# Patient Record
Sex: Female | Born: 1980
Health system: Southern US, Community
[De-identification: ages and names within clinical notes are randomized; demographics above are authoritative.]

## PROBLEM LIST (undated history)

## (undated) DIAGNOSIS — Z973 Presence of spectacles and contact lenses: Secondary | ICD-10-CM

## (undated) DIAGNOSIS — E559 Vitamin D deficiency, unspecified: Secondary | ICD-10-CM

## (undated) DIAGNOSIS — E282 Polycystic ovarian syndrome: Secondary | ICD-10-CM

## (undated) DIAGNOSIS — I10 Essential (primary) hypertension: Secondary | ICD-10-CM

## (undated) DIAGNOSIS — N200 Calculus of kidney: Secondary | ICD-10-CM

## (undated) DIAGNOSIS — Z87442 Personal history of urinary calculi: Secondary | ICD-10-CM

## (undated) HISTORY — DX: Calculus of kidney: N20.0

## (undated) HISTORY — DX: Polycystic ovarian syndrome: E28.2

## (undated) HISTORY — DX: Vitamin D deficiency, unspecified: E55.9

## (undated) HISTORY — PX: MOLE REMOVAL: SHX2046

---

## 2013-07-21 ENCOUNTER — Emergency Department (HOSPITAL_COMMUNITY)
Admission: EM | Admit: 2013-07-21 | Discharge: 2013-07-21 | Disposition: A | Discharge status: Home / Self Care | Payer: PRIVATE HEALTH INSURANCE | Attending: Emergency Medicine | Admitting: Emergency Medicine

## 2013-07-21 ENCOUNTER — Emergency Department (HOSPITAL_COMMUNITY): Payer: PRIVATE HEALTH INSURANCE

## 2013-07-21 ENCOUNTER — Encounter (HOSPITAL_COMMUNITY): Payer: Self-pay | Admitting: Emergency Medicine

## 2013-07-21 DIAGNOSIS — R112 Nausea with vomiting, unspecified: Secondary | ICD-10-CM | POA: Insufficient documentation

## 2013-07-21 DIAGNOSIS — R109 Unspecified abdominal pain: Secondary | ICD-10-CM | POA: Insufficient documentation

## 2013-07-21 LAB — CBC WITH DIFFERENTIAL/PLATELET
Basophils Absolute: 0 10*3/uL (ref 0.0–0.1)
Basophils Relative: 0 % (ref 0–1)
Eosinophils Absolute: 0.1 10*3/uL (ref 0.0–0.7)
Eosinophils Relative: 1 % (ref 0–5)
HCT: 44.8 % (ref 36.0–46.0)
Hemoglobin: 15 g/dL (ref 12.0–15.0)
Lymphocytes Relative: 23 % (ref 12–46)
Lymphs Abs: 1.9 10*3/uL (ref 0.7–4.0)
MCH: 30.6 pg (ref 26.0–34.0)
MCHC: 33.5 g/dL (ref 30.0–36.0)
MCV: 91.4 fL (ref 78.0–100.0)
Monocytes Absolute: 0.5 10*3/uL (ref 0.1–1.0)
Monocytes Relative: 6 % (ref 3–12)
Neutro Abs: 5.7 10*3/uL (ref 1.7–7.7)
Neutrophils Relative %: 70 % (ref 43–77)
Platelets: 325 10*3/uL (ref 150–400)
RBC: 4.9 MIL/uL (ref 3.87–5.11)
RDW: 12.6 % (ref 11.5–15.5)
WBC: 8.2 10*3/uL (ref 4.0–10.5)

## 2013-07-21 LAB — URINE MICROSCOPIC-ADD ON

## 2013-07-21 LAB — BASIC METABOLIC PANEL
BUN: 8 mg/dL (ref 6–23)
CO2: 22 mEq/L (ref 19–32)
Calcium: 8.9 mg/dL (ref 8.4–10.5)
Chloride: 105 mEq/L (ref 96–112)
Creatinine, Ser: 0.88 mg/dL (ref 0.50–1.10)
GFR calc Af Amer: >90 mL/min (ref >90)
GFR calc non Af Amer: 85 mL/min — ABNORMAL LOW (ref >90)
Glucose, Bld: 103 mg/dL — ABNORMAL HIGH (ref 70–99)
Potassium: 4.1 mEq/L (ref 3.7–5.3)
Sodium: 139 mEq/L (ref 137–147)

## 2013-07-21 LAB — URINALYSIS, ROUTINE W REFLEX MICROSCOPIC
Bilirubin Urine: NEGATIVE
Glucose, UA: NEGATIVE mg/dL
Ketones, ur: NEGATIVE mg/dL
Nitrite: NEGATIVE
Protein, ur: NEGATIVE mg/dL
Specific Gravity, Urine: 1.019 (ref 1.005–1.030)
Urobilinogen, UA: 0.2 mg/dL (ref 0.0–1.0)
pH: 5.5 (ref 5.0–8.0)

## 2013-07-21 MED ORDER — HYDROCODONE-ACETAMINOPHEN 5-325 MG PO TABS: 1.0000 | ORAL_TABLET | Freq: Four times a day (QID) | ORAL | Status: DC | PRN

## 2013-07-21 NOTE — Discharge Instructions (Signed)
Abdominal Pain, Women °Abdominal (stomach, pelvic, or belly) pain can be caused by many things. It is important to tell your doctor: °· The location of the pain. °· Does it come and go or is it present all the time? °· Are there things that start the pain (eating certain foods, exercise)? °· Are there other symptoms associated with the pain (fever, nausea, vomiting, diarrhea)? °All of this is helpful to know when trying to find the cause of the pain. °CAUSES  °· Stomach: virus or bacteria infection, or ulcer. °· Intestine: appendicitis (inflamed appendix), regional ileitis (Crohn's disease), ulcerative colitis (inflamed colon), irritable bowel syndrome, diverticulitis (inflamed diverticulum of the colon), or cancer of the stomach or intestine. °· Gallbladder disease or stones in the gallbladder. °· Kidney disease, kidney stones, or infection. °· Pancreas infection or cancer. °· Fibromyalgia (pain disorder). °· Diseases of the female organs: °· Uterus: fibroid (non-cancerous) tumors or infection. °· Fallopian tubes: infection or tubal pregnancy. °· Ovary: cysts or tumors. °· Pelvic adhesions (scar tissue). °· Endometriosis (uterus lining tissue growing in the pelvis and on the pelvic organs). °· Pelvic congestion syndrome (female organs filling up with blood just before the menstrual period). °· Pain with the menstrual period. °· Pain with ovulation (producing an egg). °· Pain with an IUD (intrauterine device, birth control) in the uterus. °· Cancer of the female organs. °· Functional pain (pain not caused by a disease, may improve without treatment). °· Psychological pain. °· Depression. °DIAGNOSIS  °Your doctor will decide the seriousness of your pain by doing an examination. °· Blood tests. °· X-rays. °· Ultrasound. °· CT scan (computed tomography, special type of X-ray). °· MRI (magnetic resonance imaging). °· Cultures, for infection. °· Barium enema (dye inserted in the large intestine, to better view it with  X-rays). °· Colonoscopy (looking in intestine with a lighted tube). °· Laparoscopy (minor surgery, looking in abdomen with a lighted tube). °· Major abdominal exploratory surgery (looking in abdomen with a large incision). °TREATMENT  °The treatment will depend on the cause of the pain.  °· Many cases can be observed and treated at home. °· Over-the-counter medicines recommended by your caregiver. °· Prescription medicine. °· Antibiotics, for infection. °· Birth control pills, for painful periods or for ovulation pain. °· Hormone treatment, for endometriosis. °· Nerve blocking injections. °· Physical therapy. °· Antidepressants. °· Counseling with a psychologist or psychiatrist. °· Minor or major surgery. °HOME CARE INSTRUCTIONS  °· Do not take laxatives, unless directed by your caregiver. °· Take over-the-counter pain medicine only if ordered by your caregiver. Do not take aspirin because it can cause an upset stomach or bleeding. °· Try a clear liquid diet (broth or water) as ordered by your caregiver. Slowly move to a bland diet, as tolerated, if the pain is related to the stomach or intestine. °· Have a thermometer and take your temperature several times a day, and record it. °· Bed rest and sleep, if it helps the pain. °· Avoid sexual intercourse, if it causes pain. °· Avoid stressful situations. °· Keep your follow-up appointments and tests, as your caregiver orders. °· If the pain does not go away with medicine or surgery, you may try: °· Acupuncture. °· Relaxation exercises (yoga, meditation). °· Group therapy. °· Counseling. °SEEK MEDICAL CARE IF:  °· You notice certain foods cause stomach pain. °· Your home care treatment is not helping your pain. °· You need stronger pain medicine. °· You want your IUD removed. °· You feel faint or   lightheaded. °· You develop nausea and vomiting. °· You develop a rash. °· You are having side effects or an allergy to your medicine. °SEEK IMMEDIATE MEDICAL CARE IF:  °· Your  pain does not go away or gets worse. °· You have a fever. °· Your pain is felt only in portions of the abdomen. The right side could possibly be appendicitis. The left lower portion of the abdomen could be colitis or diverticulitis. °· You are passing blood in your stools (bright red or black tarry stools, with or without vomiting). °· You have blood in your urine. °· You develop chills, with or without a fever. °· You pass out. °MAKE SURE YOU:  °· Understand these instructions. °· Will watch your condition. °· Will get help right away if you are not doing well or get worse. °Document Released: 04/16/2007 Document Revised: 09/11/2011 Document Reviewed: 05/06/2009 °[employer]® Patient Information ©2014 [employer]. ° °

## 2013-07-21 NOTE — ED Notes (Signed)
Pt states left flank towards abdomen and groin pain.   N/V also.  No change in urination.  No fever.

## 2013-07-21 NOTE — ED Provider Notes (Signed)
CSN: [id number]     Arrival date & time 07/21/13  1320 History   First MD Initiated Contact with Patient 07/21/13 1555     Chief Complaint  Patient presents with  . Flank Pain   (Consider location/radiation/quality/duration/timing/severity/associated sxs/prior Treatment) Patient is a 33 y.o. female presenting with flank pain.  Flank Pain Associated symptoms include abdominal pain. Pertinent negatives include no chest pain and no shortness of breath.    History reviewed. No pertinent past medical history. History reviewed. No pertinent past surgical history. History reviewed. No pertinent family history. History  Substance Use Topics  . Smoking status: Never Smoker   . Smokeless tobacco: Not on file  . Alcohol Use: No   OB History   Grav Para Term Preterm Abortions TAB SAB Ect Mult Living                 Review of Systems  Constitutional: Negative for fever.  Respiratory: Negative for cough and shortness of breath.   Cardiovascular: Negative for chest pain.  Gastrointestinal: Positive for nausea, vomiting and abdominal pain. Negative for diarrhea.  Genitourinary: Positive for flank pain.  All other systems reviewed and are negative.    Allergies  Review of patient's allergies indicates no known allergies.  Home Medications   Current Outpatient Rx  Name  Route  Sig  Dispense  Refill  . ibuprofen (ADVIL,MOTRIN) 200 MG tablet   Oral   Take 200 mg by mouth every 6 (six) hours as needed (pain).          BP 122/83  Pulse 93  Temp(Src) 98.6 F (37 C) (Oral)  Resp 16  SpO2 96%  LMP 07/07/2013 Physical Exam  Nursing note and vitals reviewed. Constitutional: She is oriented to person, place, and time. She appears well-developed and well-nourished. No distress.  HENT:  Head: Normocephalic and atraumatic.  Mouth/Throat: Oropharynx is clear and moist. No oropharyngeal exudate.  Eyes: EOM are normal. Pupils are equal, round, and reactive to light.  Neck: Normal range  of motion. Neck supple.  Cardiovascular: Normal rate and regular rhythm.  Exam reveals no friction rub.   No murmur heard. Pulmonary/Chest: Effort normal and breath sounds normal. No respiratory distress. She has no wheezes. She has no rales.  Abdominal: Soft. She exhibits no distension. There is no tenderness. There is no rebound.  Musculoskeletal: Normal range of motion. She exhibits no edema.  Neurological: She is alert and oriented to person, place, and time. She exhibits normal muscle tone.  Skin: No rash noted. She is not diaphoretic.    ED Course  Procedures (including critical care time) Labs Review Labs Reviewed  BASIC METABOLIC PANEL - Abnormal; Notable for the following:    Glucose, Bld 103 (*)    GFR calc non Af Amer 85 (*)    All other components within normal limits  URINALYSIS, ROUTINE W REFLEX MICROSCOPIC - Abnormal; Notable for the following:    APPearance CLOUDY (*)    Hgb urine dipstick LARGE (*)    Leukocytes, UA TRACE (*)    All other components within normal limits  CBC WITH DIFFERENTIAL  URINE MICROSCOPIC-ADD ON   Imaging Review Ct Abdomen Pelvis Wo Contrast  07/21/2013   CLINICAL DATA:  Left-sided abdominal pain.  EXAM: CT ABDOMEN AND PELVIS WITHOUT CONTRAST  TECHNIQUE: Multidetector CT imaging of the abdomen and pelvis was performed following the standard protocol without intravenous contrast.  COMPARISON:  None.  FINDINGS: Lung bases are essentially clear.  Heart is normal in  size.  Normal liver and spleen.  Shaky densities are suggested in the mildly distended gallbladder. There may be gallstones. No evidence of acute cholecystitis. No bile duct dilation.  Normal pancreas.  No adrenal masses.  4 mm nonobstructing stone in the lower pole of the left kidney. No other intrarenal stones. No renal masses. No hydronephrosis. The ureters are normal in course and in caliber without stones the bladder is minimally distended but otherwise unremarkable. No evidence of  obstructive uropathy.  Normal uterus and adnexa.  No adenopathy.  No abnormal fluid collections.  Normal:  .  Normal small bowel.  Normal appendix.  Minor degenerative changes noted of the visualized spine. No osteoblastic or osteolytic lesions.  IMPRESSION: 1. No acute findings. No ureteral stone or evidence of obstructive uropathy. 2. 4 mm nonobstructing stone in the lower pole of the left kidney. No other intrarenal stones. No other renal abnormalities. 3. No other significant findings. No findings to explain left sided abdominal pain.   Electronically Signed   By: Amie Portlandavid  Ormond M.D.   On: 07/21/2013 17:28    EKG Interpretation   None       MDM   1. Left flank pain    70F presents with L flank pain. Gone now. Intermittent, crampy, radiating to L groin. Vomiting x 3. No fevers. Pain free at this time. No hematuria noted. Exam benign. No history, no family hx of stones. UA with hematuria. Likely stone, I gave patient option of medical management without CT due to no change in renal function, patient would like CT. CT is warranted and is reasonable.  CT negative for intra-ureter stone. She does have small stone in kidney that is nonobstructing. With hematuria, query recently passed stone. I informed patient that we need to do a pelvic to do a complete workup, patient deferred. Stable for discharge.   Dagmar HaitWilliam Tabita Corbo, MD 07/22/13 0001

## 2013-11-14 ENCOUNTER — Ambulatory Visit (INDEPENDENT_AMBULATORY_CARE_PROVIDER_SITE_OTHER): Payer: No Typology Code available for payment source | Admitting: Internal Medicine

## 2013-11-14 VITALS — BP 124/88 | HR 96 | Temp 98.5°F | Resp 18 | Ht 63.5 in | Wt 207.0 lb

## 2013-11-14 DIAGNOSIS — Z6836 Body mass index (BMI) 36.0-36.9, adult: Secondary | ICD-10-CM

## 2013-11-14 DIAGNOSIS — R030 Elevated blood-pressure reading, without diagnosis of hypertension: Secondary | ICD-10-CM

## 2013-11-14 DIAGNOSIS — IMO0001 Reserved for inherently not codable concepts without codable children: Secondary | ICD-10-CM

## 2013-11-14 LAB — POCT URINALYSIS DIPSTICK
Bilirubin, UA: NEGATIVE
Blood, UA: NEGATIVE
Glucose, UA: NEGATIVE
Ketones, UA: 15
Leukocytes, UA: NEGATIVE
Nitrite, UA: NEGATIVE
Protein, UA: NEGATIVE
Spec Grav, UA: 1.02
Urobilinogen, UA: 0.2
pH, UA: 7

## 2013-11-14 LAB — POCT CBC
Granulocyte percent: 56.3 %G (ref 37–80)
HCT, POC: 47 % (ref 37.7–47.9)
Hemoglobin: 15.1 g/dL (ref 12.2–16.2)
Lymph, poc: 2.9 (ref 0.6–3.4)
MCH, POC: 30.6 pg (ref 27–31.2)
MCHC: 32.1 g/dL (ref 31.8–35.4)
MCV: 95.4 fL (ref 80–97)
MID (cbc): 0.5 (ref 0–0.9)
MPV: 9.5 fL (ref 0–99.8)
POC Granulocyte: 4.3 (ref 2–6.9)
POC LYMPH PERCENT: 37.3 %L (ref 10–50)
POC MID %: 6.4 %M (ref 0–12)
Platelet Count, POC: 377 10*3/uL (ref 142–424)
RBC: 4.93 M/uL (ref 4.04–5.48)
RDW, POC: 12.5 %
WBC: 7.7 10*3/uL (ref 4.6–10.2)

## 2013-11-14 LAB — LIPID PANEL
Cholesterol: 130 mg/dL (ref 0–200)
HDL: 50 mg/dL (ref >39)
LDL Cholesterol: 69 mg/dL (ref 0–99)
Total CHOL/HDL Ratio: 2.6 Ratio
Triglycerides: 54 mg/dL (ref <150)
VLDL: 11 mg/dL (ref 0–40)

## 2013-11-14 LAB — COMPREHENSIVE METABOLIC PANEL
ALT: 17 U/L (ref 0–35)
AST: 13 U/L (ref 0–37)
Albumin: 4.2 g/dL (ref 3.5–5.2)
Alkaline Phosphatase: 73 U/L (ref 39–117)
BUN: 11 mg/dL (ref 6–23)
CO2: 23 mEq/L (ref 19–32)
Calcium: 9.6 mg/dL (ref 8.4–10.5)
Chloride: 107 mEq/L (ref 96–112)
Creat: 0.68 mg/dL (ref 0.50–1.10)
Glucose, Bld: 87 mg/dL (ref 70–99)
Potassium: 4.5 mEq/L (ref 3.5–5.3)
Sodium: 140 mEq/L (ref 135–145)
Total Bilirubin: 0.5 mg/dL (ref 0.2–1.2)
Total Protein: 7.1 g/dL (ref 6.0–8.3)

## 2013-11-14 NOTE — Progress Notes (Signed)
   Subjective:    Patient ID: Margaret Rice, female    DOB: 08/10/1980, 33 y.o.   MRN: [id number] This chart was scribed for Ellamae Siaobert Jhovani Griswold, MD by Danella Maiersaroline Early, ED Scribe. This patient was seen in room 1 and the patient's care was started at 4:21 PM.  Chief Complaint  Patient presents with  . Hypertension    around 130/90 when taken at home    HPI HPI Comments: Margaret Rice is a 33 y.o. female who presents to the [facility] complaining of recent high blood pressure readings. She states she has been checking her BP with the cuff at her gym every day, highest recording was 140/96. She states her blood pressure tends to be normal in the mornings. She states she is under considerable stress because she is graduating from law school and is [facility]. Her mom and dad both have HTN. She has no chronic medical problems and is not on any daily medications besides vitamins. She had a kidney stone in January. She has not had a UA since then. She states she has lost 30 pounds recently due to diet and daily exercise. She denies fatigue, sleep difficulty, urinary problems, digestions problems, sleep disorders. She has never given birth. She denies SOB, palpitations, leg swelling. No family h/o DM. She is not a smoker.   PCP - No PCP Per Patient  There are no active problems to display for this patient.  No current outpatient prescriptions on file prior to visit.   No current facility-administered medications on file prior to visit.  no drug use/nonsmok FH--HTN parents  Review of Systems  Constitutional: Positive for activity change (exercise). Negative for appetite change, fatigue and unexpected weight change.  Eyes: Negative for visual disturbance.  Respiratory: Negative for chest tightness and shortness of breath.   Cardiovascular: Negative for chest pain, palpitations and leg swelling.  Gastrointestinal: Negative for vomiting, abdominal pain, diarrhea  and constipation.  Genitourinary: Negative for dysuria, urgency, frequency, hematuria, decreased urine volume and difficulty urinating.  Neurological: Negative for headaches.  Psychiatric/Behavioral: Negative for sleep disturbance, dysphoric mood and decreased concentration.       Objective:   Physical Exam  Nursing note and vitals reviewed. Constitutional: She is oriented to person, place, and time. She appears well-developed and well-nourished. No distress.  HENT:  Head: Normocephalic and atraumatic.  Eyes: EOM are normal.  Neck: Neck supple. No tracheal deviation present.  Cardiovascular: Normal rate.   Pulmonary/Chest: Effort normal. No respiratory distress.  Abdominal: There is no hepatosplenomegaly, splenomegaly or hepatomegaly.  Musculoskeletal: Normal range of motion.  Neurological: She is alert and oriented to person, place, and time.  Skin: Skin is warm and dry.  Psychiatric: She has a normal mood and affect. Her behavior is normal.     Filed Vitals:   11/14/13 1531  BP: 124/88  Pulse: 96  Temp: 98.5 F (36.9 C)  Resp: 18  Height: 5' 3.5" (1.613 m)  Weight: 207 lb (93.895 kg)  SpO2: 98%        Assessment & Plan:    I have completed the patient encounter in its entirety as documented by the scribe, with editing by me where necessary. Delvonte Berenson P. Merla Richesoolittle, M.D. Blood pressure elevated - Plan: POCT urinalysis dipstick, POCT CBC, Comprehensive metabolic panel, TSH, Lipid panel  BMI>36  To continue wt loss w/diet/exec and reck after bar exams

## 2013-11-15 DIAGNOSIS — Z6836 Body mass index (BMI) 36.0-36.9, adult: Secondary | ICD-10-CM | POA: Insufficient documentation

## 2013-11-15 LAB — TSH: TSH: 0.731 u[IU]/mL (ref 0.350–4.500)

## 2013-11-19 ENCOUNTER — Encounter: Payer: Self-pay | Admitting: Internal Medicine

## 2016-07-25 ENCOUNTER — Encounter: Payer: Self-pay | Admitting: Family Medicine

## 2016-07-25 ENCOUNTER — Ambulatory Visit (INDEPENDENT_AMBULATORY_CARE_PROVIDER_SITE_OTHER): Payer: BLUE CROSS/BLUE SHIELD | Admitting: Family Medicine

## 2016-07-25 VITALS — BP 131/89 | HR 100 | Temp 98.2°F | Resp 16 | Ht 64.0 in | Wt 201.6 lb

## 2016-07-25 DIAGNOSIS — Z0000 Encounter for general adult medical examination without abnormal findings: Secondary | ICD-10-CM

## 2016-07-25 DIAGNOSIS — Z131 Encounter for screening for diabetes mellitus: Secondary | ICD-10-CM | POA: Diagnosis not present

## 2016-07-25 DIAGNOSIS — Z23 Encounter for immunization: Secondary | ICD-10-CM | POA: Diagnosis not present

## 2016-07-25 DIAGNOSIS — N926 Irregular menstruation, unspecified: Secondary | ICD-10-CM

## 2016-07-25 DIAGNOSIS — E6609 Other obesity due to excess calories: Secondary | ICD-10-CM | POA: Diagnosis not present

## 2016-07-25 DIAGNOSIS — Z13220 Encounter for screening for lipoid disorders: Secondary | ICD-10-CM | POA: Diagnosis not present

## 2016-07-25 DIAGNOSIS — L680 Hirsutism: Secondary | ICD-10-CM

## 2016-07-25 DIAGNOSIS — Z114 Encounter for screening for human immunodeficiency virus [HIV]: Secondary | ICD-10-CM

## 2016-07-25 DIAGNOSIS — Z1329 Encounter for screening for other suspected endocrine disorder: Secondary | ICD-10-CM

## 2016-07-25 DIAGNOSIS — Z6834 Body mass index (BMI) 34.0-34.9, adult: Secondary | ICD-10-CM

## 2016-07-25 LAB — POCT URINALYSIS DIP (MANUAL ENTRY)
Bilirubin, UA: NEGATIVE
Blood, UA: NEGATIVE
Glucose, UA: NEGATIVE
Ketones, POC UA: NEGATIVE
Leukocytes, UA: NEGATIVE
Nitrite, UA: NEGATIVE
Protein Ur, POC: NEGATIVE
Spec Grav, UA: 1.01
Urobilinogen, UA: 0.2
pH, UA: 6

## 2016-07-25 MED ORDER — LEVONORGEST-ETH ESTRAD 91-DAY 0.15-0.03 &0.01 MG PO TABS: 1.0000 | ORAL_TABLET | Freq: Every day | ORAL | 4 refills | Status: DC

## 2016-07-25 NOTE — Patient Instructions (Signed)
   IF you received an x-ray today, you will receive an invoice from [facility]. Please contact [facility] at [phone] with questions or concerns regarding your invoice.   IF you received labwork today, you will receive an invoice from [facility]. Please contact [facility] at [phone] with questions or concerns regarding your invoice.   Our billing staff will not be able to assist you with questions regarding bills from these companies.  You will be contacted with the lab results as soon as they are available. The fastest way to get your results is to activate your My Chart account. Instructions are located on the last page of this paperwork. If you have not heard from us regarding the results in 2 weeks, please contact this office.    Keeping You Healthy  Get These Tests 1. Blood Pressure- Have your blood pressure checked once a year by your health care provider.  Normal blood pressure is 120/80. 2. Weight- Have your body mass index (BMI) calculated to screen for obesity.  BMI is measure of body fat based on height and weight.  You can also calculate your own BMI at www.nhlbisupport.com/bmi/. 3. Cholesterol- Have your cholesterol checked every 5 years starting at age 20 then yearly starting at age 45. 4. Chlamydia, HIV, and other sexually transmitted diseases- Get screened every year until age 25, then within three months of each new sexual provider. 5. Pap Test - Every 1-5 years; discuss with your health care provider. 6. Mammogram- Every 1-2 years starting at age 40--50  Take these medicines  Calcium with Vitamin D-Your body needs 1200 mg of Calcium each day and 800-1000 IU of Vitamin D daily.  Your body can only absorb 500 mg of Calcium at a time so Calcium must be taken in 2 or 3 divided doses throughout the day.  Multivitamin with folic acid- Once daily if it is possible for you to become pregnant.  Get these Immunizations  Gardasil-Series of three doses;  prevents HPV related illness such as genital warts and cervical cancer.  Menactra-Single dose; prevents meningitis.  Tetanus shot- Every 10 years.  Flu shot-Every year.  Take these steps 1. Do not smoke-Your healthcare provider can help you quit.  For tips on how to quit go to www.smokefree.gov or call 1-800 QUITNOW. 2. Be physically active- Exercise 5 days a week for at least 30 minutes.  If you are not already physically active, start slow and gradually work up to 30 minutes of moderate physical activity.  Examples of moderate activity include walking briskly, dancing, swimming, bicycling, etc. 3. Breast Cancer- A self breast exam every month is important for early detection of breast cancer.  For more information and instruction on self breast exams, ask your healthcare provider or www.womenshealth.gov/faq/breast-self-exam.cfm. 4. Eat a healthy diet- Eat a variety of healthy foods such as fruits, vegetables, whole grains, low fat milk, low fat cheeses, yogurt, lean meats, poultry and fish, beans, nuts, tofu, etc.  For more information go to www. Thenutritionsource.org 5. Drink alcohol in moderation- Limit alcohol intake to one drink or less per day. Never drink and drive. 6. Depression- Your emotional health is as important as your physical health.  If you're feeling down or losing interest in things you normally enjoy please talk to your healthcare provider about being screened for depression. 7. Dental visit- Brush and floss your teeth twice daily; visit your dentist twice a year. 8. Eye doctor- Get an eye exam at least every 2 years. 9. Helmet   use- Always wear a helmet when riding a bicycle, motorcycle, rollerblading or skateboarding. 10. Safe sex- If you may be exposed to sexually transmitted infections, use a condom. 11. Seat belts- Seat belts can save your live; always wear one. 12. Smoke/Carbon Monoxide detectors- These detectors need to be installed on the appropriate level of your  home. Replace batteries at least once a year. 13. Skin cancer- When out in the sun please cover up and use sunscreen 15 SPF or higher. 14. Violence- If anyone is threatening or hurting you, please tell your healthcare provider.        

## 2016-07-25 NOTE — Progress Notes (Signed)
Subjective:    Patient ID: Margaret Rice, female    DOB: Dec 15, 1980, 36 y.o.   MRN: [id number]  07/25/2016  Annual Exam and Medication Refill Margaret Rice(VIORELE)  HPI This 36 y.o. female presents for Complete Physical Examination and to establish care.  Last physical:  06-2015 Pap smear:  06-2015; no previous sexual activity; heavy menses and mood swings at that time.   Eye exam: Dental exam:   Heavy menses: lighter with OCP; still heavy and cramping; bleeds 5-7 days with OCPs;   Changes pads five per day or more; saturates pad heavily.  Gynecologist performed pelvic us and concluded that thick endometrium; scheduled D&C and cancelled due to lack of comfort.  Would like referral to gynecologist.  Also recommend bx and camera.  Reluctant.Always had irregular menses.  Ignored self from 2012, went through law school and ignored self.  Taking iron supplement for possible anemia.  Also has performed laser hair removal.   Dermatologist has expressed concerns about PCOS due to irregular menses and hirsutism; previous gynecologist did not feel blood work warranted; "most women have it".     Now working in IT.   Immunization History  Administered Date(s) Administered  . Influenza-Unspecified 04/02/2016  . Tdap 07/25/2016   BP Readings from Last 3 Encounters:  07/25/16 131/89  11/14/13 124/88  07/21/13 122/79   Wt Readings from Last 3 Encounters:  07/25/16 201 lb 9.6 oz (91.4 kg)  11/14/13 207 lb (93.9 kg)    [email or web address]   Review of Systems  Constitutional: Negative for activity change, appetite change, chills, diaphoresis, fatigue, fever and unexpected weight change.  HENT: Negative for congestion, dental problem, drooling, ear discharge, ear pain, facial swelling, hearing loss, mouth sores, nosebleeds, postnasal drip, rhinorrhea, sinus pressure, sneezing, sore throat, tinnitus, trouble swallowing and voice change.   Eyes: Negative for photophobia, pain, discharge, redness, itching  and visual disturbance.  Respiratory: Negative for apnea, cough, choking, chest tightness, shortness of breath, wheezing and stridor.   Cardiovascular: Negative for chest pain, palpitations and leg swelling.  Gastrointestinal: Positive for diarrhea. Negative for abdominal distention, abdominal pain, anal bleeding, blood in stool, constipation, nausea, rectal pain and vomiting.  Endocrine: Negative for cold intolerance, heat intolerance, polydipsia, polyphagia and polyuria.  Genitourinary: Negative for decreased urine volume, difficulty urinating, dyspareunia, dysuria, enuresis, flank pain, frequency, genital sores, hematuria, menstrual problem, pelvic pain, urgency, vaginal bleeding, vaginal discharge and vaginal pain.       Nocturia x 0.  Rare stress incontinence.  Musculoskeletal: Negative for arthralgias, back pain, gait problem, joint swelling, myalgias, neck pain and neck stiffness.  Skin: Negative for color change, pallor, rash and wound.  Allergic/Immunologic: Negative for environmental allergies, food allergies and immunocompromised state.  Neurological: Negative for dizziness, tremors, seizures, syncope, facial asymmetry, speech difficulty, weakness, light-headedness, numbness and headaches.  Hematological: Negative for adenopathy. Does not bruise/bleed easily.  Psychiatric/Behavioral: Negative for agitation, behavioral problems, confusion, decreased concentration, dysphoric mood, hallucinations, self-injury, sleep disturbance and suicidal ideas. The patient is not nervous/anxious and is not hyperactive.        Bedtime 03-12-10; wakes up 6:00am.      Past Medical History:  Diagnosis Date  . Kidney stones    Past Surgical History:  Procedure Laterality Date  . MOLE REMOVAL     BACK   No Known Allergies  Social History   Social History  . Marital status: Single    Spouse name: N/A  . Number of children: N/A  . Years of education: N/A  Occupational History  . Not on file.    Social History Main Topics  . Smoking status: Never Smoker  . Smokeless tobacco: Never Used  . Alcohol use No  . Drug use: No  . Sexual activity: Not on file   Other Topics Concern  . Not on file   Social History Narrative  . No narrative on file   Family History  Problem Relation Age of Onset  . Hypertension Mother   . Hypertension Father   . Diverticulitis Father      PARTIAL COLON REMOVED  . Heart disease Maternal Grandmother   . Heart disease Paternal Grandmother        Objective:    BP 131/89 (BP Location: Right Arm, Patient Position: Sitting, Cuff Size: Normal)   Pulse 100   Temp 98.2 F (36.8 C) (Oral)   Resp 16   Ht 5\' 4"  (1.626 m)   Wt 201 lb 9.6 oz (91.4 kg)   LMP 07/17/2016   SpO2 98%   BMI 34.60 kg/m  Physical Exam  Constitutional: She is oriented to person, place, and time. She appears well-developed and well-nourished. No distress.  HENT:  Head: Normocephalic and atraumatic.  Right Ear: External ear normal.  Left Ear: External ear normal.  Nose: Nose normal.  Mouth/Throat: Oropharynx is clear and moist.  Eyes: Conjunctivae and EOM are normal. Pupils are equal, round, and reactive to light.  Neck: Normal range of motion and full passive range of motion without pain. Neck supple. No JVD present. Carotid bruit is not present. No thyromegaly present.  Cardiovascular: Normal rate, regular rhythm and normal heart sounds.  Exam reveals no gallop and no friction rub.   No murmur heard. Pulmonary/Chest: Effort normal and breath sounds normal. She has no wheezes. She has no rales.  Abdominal: Soft. Bowel sounds are normal. She exhibits no distension and no mass. There is no tenderness. There is no rebound and no guarding.  Musculoskeletal:       Right shoulder: Normal.       Left shoulder: Normal.       Cervical back: Normal.  Lymphadenopathy:    She has no cervical adenopathy.  Neurological: She is alert and oriented to person, place, and time. She  has normal reflexes. No cranial nerve deficit. She exhibits normal muscle tone. Coordination normal.  Skin: Skin is warm and dry. No rash noted. She is not diaphoretic. No erythema. No pallor.  Psychiatric: She has a normal mood and affect. Her behavior is normal. Judgment and thought content normal.  Nursing note and vitals reviewed.  Depression screen PHQ 2/9 07/25/2016  Decreased Interest 0  Down, Depressed, Hopeless 0  PHQ - 2 Score 0        Assessment & Plan:   1. Routine physical examination   2. Screening for diabetes mellitus   3. Screening, lipid   4. Screening for HIV (human immunodeficiency virus)   5. Screening for thyroid disorder   6. Hirsutism   7. Irregular menses   8. Need for Tdap vaccination   9. Class 1 obesity due to excess calories without serious comorbidity with body mass index (BMI) of 34.0 to 34.9 in adult    -anticipatory guidance provided --- exercise, weight loss, low-calorie food choices. -refer to gynecology to evaluate irregular menses further; may desire IUD insertion.  No gynecological exam performed today. -obtain labs. -s/p TDAP. -rx for different OCP provided to decrease frequency of menses.   Orders Placed This Encounter  Procedures  .  Tdap vaccine greater than or equal to 7yo IM  . CBC with Differential/Platelet  . Comprehensive metabolic panel    Order Specific Question:   Has the patient fasted?    Answer:   Yes  . Hemoglobin A1c  . Lipid panel    Order Specific Question:   Has the patient fasted?    Answer:   Yes  . TSH  . HIV antibody  . Testosterone,Free and Total  . FSH/LH  . Ambulatory referral to Gynecology    Referral Priority:   Routine    Referral Type:   Consultation    Referral Reason:   Specialty Services Required    Requested Specialty:   Gynecology    Number of Visits Requested:   1  . POCT urinalysis dipstick   Meds ordered this encounter  Medications  . desogestrel-ethinyl estradiol (VIORELE)  0.15-0.02/0.01 MG (21/5) tablet    Sig: Take 1 tablet by mouth daily.  . Prenatal Vit-Fe Fumarate-FA (PRENATAL VITAMIN PO)    Sig: Take by mouth daily.  . IBUPROFEN PO    Sig: Take by mouth as needed.  . Ferrous Sulfate (IRON SUPPLEMENT PO)    Sig: Take by mouth daily.  . TRETINOIN EX    Sig: Apply topically as needed.  . Levonorgestrel-Ethinyl Estradiol (AMETHIA,CAMRESE) 0.15-0.03 &0.01 MG tablet    Sig: Take 1 tablet by mouth daily.    Dispense:  1 Package    Refill:  4    No Follow-up on file.   Meriah Shands Paulita Fujita, M.D. [facility] [address] [place], [place]  [address] [phone] phone [phone] fax

## 2016-07-26 LAB — CBC WITH DIFFERENTIAL/PLATELET
Basophils Absolute: 0 10*3/uL (ref 0.0–0.2)
Basos: 1 %
EOS (ABSOLUTE): 0.1 10*3/uL (ref 0.0–0.4)
Eos: 2 %
Hematocrit: 46.4 % (ref 34.0–46.6)
Hemoglobin: 15 g/dL (ref 11.1–15.9)
Immature Grans (Abs): 0 10*3/uL (ref 0.0–0.1)
Immature Granulocytes: 0 %
Lymphocytes Absolute: 2.3 10*3/uL (ref 0.7–3.1)
Lymphs: 38 %
MCH: 30.3 pg (ref 26.6–33.0)
MCHC: 32.3 g/dL (ref 31.5–35.7)
MCV: 94 fL (ref 79–97)
Monocytes Absolute: 0.3 10*3/uL (ref 0.1–0.9)
Monocytes: 5 %
Neutrophils Absolute: 3.3 10*3/uL (ref 1.4–7.0)
Neutrophils: 54 %
Platelets: 317 10*3/uL (ref 150–379)
RBC: 4.95 x10E6/uL (ref 3.77–5.28)
RDW: 12.7 % (ref 12.3–15.4)
WBC: 6 10*3/uL (ref 3.4–10.8)

## 2016-07-26 LAB — COMPREHENSIVE METABOLIC PANEL
ALT: 15 IU/L (ref 0–32)
AST: 14 IU/L (ref 0–40)
Albumin/Globulin Ratio: 1.5 (ref 1.2–2.2)
Albumin: 4 g/dL (ref 3.5–5.5)
Alkaline Phosphatase: 72 IU/L (ref 39–117)
BUN/Creatinine Ratio: 12 (ref 9–23)
BUN: 9 mg/dL (ref 6–20)
Bilirubin Total: 0.6 mg/dL (ref 0.0–1.2)
CO2: 22 mmol/L (ref 18–29)
Calcium: 9.5 mg/dL (ref 8.7–10.2)
Chloride: 104 mmol/L (ref 96–106)
Creatinine, Ser: 0.77 mg/dL (ref 0.57–1.00)
GFR calc Af Amer: 113 mL/min/{1.73_m2} (ref >59)
GFR calc non Af Amer: 98 mL/min/{1.73_m2} (ref >59)
Globulin, Total: 2.6 g/dL (ref 1.5–4.5)
Glucose: 97 mg/dL (ref 65–99)
Potassium: 4.9 mmol/L (ref 3.5–5.2)
Sodium: 141 mmol/L (ref 134–144)
Total Protein: 6.6 g/dL (ref 6.0–8.5)

## 2016-07-26 LAB — HIV ANTIBODY (ROUTINE TESTING W REFLEX): HIV Screen 4th Generation wRfx: NONREACTIVE

## 2016-07-26 LAB — FSH/LH
FSH: 4.6 m[IU]/mL
LH: 7 m[IU]/mL

## 2016-07-26 LAB — LIPID PANEL
Chol/HDL Ratio: 3.3 ratio units (ref 0.0–4.4)
Cholesterol, Total: 205 mg/dL — ABNORMAL HIGH (ref 100–199)
HDL: 62 mg/dL (ref >39)
LDL Calculated: 123 mg/dL — ABNORMAL HIGH (ref 0–99)
Triglycerides: 98 mg/dL (ref 0–149)
VLDL Cholesterol Cal: 20 mg/dL (ref 5–40)

## 2016-07-26 LAB — TSH: TSH: 0.722 u[IU]/mL (ref 0.450–4.500)

## 2016-07-26 LAB — TESTOSTERONE,FREE AND TOTAL
Testosterone, Free: 2.3 pg/mL (ref 0.0–4.2)
Testosterone: 42 ng/dL (ref 8–48)

## 2016-07-26 LAB — HEMOGLOBIN A1C
Est. average glucose Bld gHb Est-mCnc: 100 mg/dL
Hgb A1c MFr Bld: 5.1 % (ref 4.8–5.6)

## 2016-07-31 ENCOUNTER — Encounter: Payer: Self-pay | Admitting: Family Medicine

## 2016-07-31 DIAGNOSIS — Z6834 Body mass index (BMI) 34.0-34.9, adult: Secondary | ICD-10-CM

## 2016-07-31 DIAGNOSIS — L680 Hirsutism: Secondary | ICD-10-CM | POA: Insufficient documentation

## 2016-07-31 DIAGNOSIS — E6609 Other obesity due to excess calories: Secondary | ICD-10-CM | POA: Insufficient documentation

## 2017-05-14 ENCOUNTER — Ambulatory Visit (INDEPENDENT_AMBULATORY_CARE_PROVIDER_SITE_OTHER): Payer: BLUE CROSS/BLUE SHIELD | Admitting: Emergency Medicine

## 2017-05-14 ENCOUNTER — Other Ambulatory Visit: Payer: Self-pay

## 2017-05-14 ENCOUNTER — Encounter: Payer: Self-pay | Admitting: Emergency Medicine

## 2017-05-14 ENCOUNTER — Ambulatory Visit (INDEPENDENT_AMBULATORY_CARE_PROVIDER_SITE_OTHER): Payer: BLUE CROSS/BLUE SHIELD

## 2017-05-14 VITALS — BP 124/68 | HR 104 | Temp 99.0°F | Resp 16 | Ht 63.5 in | Wt 201.4 lb

## 2017-05-14 DIAGNOSIS — M79671 Pain in right foot: Secondary | ICD-10-CM

## 2017-05-14 DIAGNOSIS — S96911A Strain of unspecified muscle and tendon at ankle and foot level, right foot, initial encounter: Secondary | ICD-10-CM | POA: Diagnosis not present

## 2017-05-14 NOTE — Progress Notes (Signed)
Margaret Rice 36 y.o.   Chief Complaint  Patient presents with  . Foot Pain    x 2 days    HISTORY OF PRESENT ILLNESS: This is a 36 y.o. female complaining of right foot pain x 2 days; started after minor trauma sustained while walking.  HPI   Prior to Admission medications   Medication Sig Start Date End Date Taking? Authorizing Provider  Ferrous Sulfate (IRON SUPPLEMENT PO) Take by mouth daily.   Yes [provider]  IBUPROFEN PO Take by mouth as needed.   Yes [provider]  Levonorgestrel-Ethinyl Estradiol (AMETHIA,CAMRESE) 0.15-0.03 &0.01 MG tablet Take 1 tablet by mouth daily. 07/25/16  Yes Ethelda ChickSmith, Kristi M, MD  OVER THE COUNTER MEDICATION daily.   Yes [provider]  OVER THE COUNTER MEDICATION daily.   Yes [provider]  TRETINOIN EX Apply topically as needed.   Yes [provider]  desogestrel-ethinyl estradiol (VIORELE) 0.15-0.02/0.01 MG (21/5) tablet Take 1 tablet by mouth daily.    [provider]  Prenatal Vit-Fe Fumarate-FA (PRENATAL VITAMIN PO) Take by mouth daily.    [provider]    No Known Allergies  Patient Active Problem List   Diagnosis Date Noted  . Class 1 obesity due to excess calories without serious comorbidity with body mass index (BMI) of 34.0 to 34.9 in adult 07/31/2016  . Hirsutism 07/31/2016    Past Medical History:  Diagnosis Date  . Kidney stones     Past Surgical History:  Procedure Laterality Date  . MOLE REMOVAL     BACK    Social History   Socioeconomic History  . Marital status: Single    Spouse name: Not on file  . Number of children: Not on file  . Years of education: Not on file  . Highest education level: Not on file  Social Needs  . Financial resource strain: Not on file  . Food insecurity - worry: Not on file  . Food insecurity - inability: Not on file  . Transportation needs - medical: Not on file  . Transportation needs - non-medical: Not on file   Occupational History  . Not on file  Tobacco Use  . Smoking status: Never Smoker  . Smokeless tobacco: Never Used  Substance and Sexual Activity  . Alcohol use: No  . Drug use: No  . Sexual activity: Not on file  Other Topics Concern  . Not on file  Social History Narrative  . Not on file    Family History  Problem Relation Age of Onset  . Hypertension Mother   . Hypertension Father   . Diverticulitis Father         PARTIAL COLON REMOVED  . Heart disease Maternal Grandmother   . Heart disease Paternal Grandmother      Review of Systems  Constitutional: Negative.  Negative for chills and fever.  Respiratory: Negative for shortness of breath.   Gastrointestinal: Negative for nausea and vomiting.  Musculoskeletal: Positive for joint pain (right foot).  All other systems reviewed and are negative.  Vitals:   05/14/17 1626  BP: 124/68  Pulse: (!) 104  Resp: 16  Temp: 99 F (37.2 C)  SpO2: 99%     Physical Exam  Constitutional: She is oriented to person, place, and time. She appears well-developed and well-nourished.  HENT:  Head: Normocephalic and atraumatic.  Eyes: Pupils are equal, round, and reactive to light.  Cardiovascular: Normal rate.  Pulmonary/Chest: Effort normal.  Musculoskeletal:  Right  foot: NVI with FROM; mild tenderness lateral aspect; no bruising or open wounds.  Neurological: She is alert and oriented to person, place, and time.  Skin: Skin is warm and dry. Capillary refill takes less than 2 seconds.  Psychiatric: She has a normal mood and affect. Her behavior is normal.  Vitals reviewed.  Dg Foot Complete Right  Result Date: 05/14/2017 CLINICAL DATA:  Foot pain. EXAM: RIGHT FOOT COMPLETE - 3+ VIEW COMPARISON:  None. FINDINGS: There is no evidence of fracture or dislocation. There is no evidence of arthropathy or other focal bone abnormality. Soft tissues are unremarkable. IMPRESSION: Negative. Electronically Signed   By: Elsie StainJohn T Curnes M.D.    On: 05/14/2017 16:55    ASSESSMENT & PLAN: [name] was seen today for foot pain.  Diagnoses and all orders for this visit:  Foot pain, right -     DG Foot Complete Right; Future  Strain of right foot, initial encounter   Patient Instructions       IF you received an x-ray today, you will [facility] an [facility]. Please contact [facility] at [phone] with questions or concerns regarding your invoice.   IF you received labwork today, you will receive an invoice from [facility]. Please contact [facility] at [phone] with questions or concerns regarding your invoice.   Our billing staff will not be able to assist you with questions regarding bills from these companies.  You will be contacted with the lab results as soon as they are available. The fastest way to get your results is to activate your My Chart account. Instructions are located on the last page of this paperwork. If you have not heard from us regarding the results in 2 weeks, please contact this office.    Foot Sprain A foot sprain is an injury to one of the strong bands of tissue (ligaments) that connect and support the many bones in your feet. The ligament can be stretched too much or it can tear. A tear can be either partial or complete. The severity of the sprain depends on how much of the ligament was damaged or torn. What are the causes? A foot sprain is usually caused by suddenly twisting or pivoting your foot. What increases the risk? This injury is more likely to occur in people who:  Play a sport, such as basketball or football.  Exercise or play a sport without warming up.  Start a new workout or sport.  Suddenly increase how long or hard they exercise or play a sport.  What are the signs or symptoms? Symptoms of this condition start soon after an injury and include:  Pain, especially in the arch of the foot.  Bruising.  Swelling.  Inability to walk or use the  foot to support body weight.  How is this diagnosed? This condition is diagnosed with a medical history and physical exam. You may also have imaging tests, such as:  X-rays to make sure there are no broken bones (fractures).  MRI to see if the ligament has torn.  How is this treated? Treatment varies depending on the severity of your sprain. Mild sprains can be treated with rest, ice, compression, and elevation (Rice). If your ligament is overstretched or partially torn, treatment usually involves keeping your foot in a fixed position (immobilization) for a period of time. To help you do this, your health care provider will apply a bandage, splint, or walking boot to keep your foot from moving until it heals. You may  also be advised to use crutches or a scooter for a few weeks to avoid bearing weight on your foot while it is healing. If your ligament is fully torn, you may need surgery to reconnect the ligament to the bone. After surgery, a cast or splint will be applied and will need to stay on your foot while it heals. Your health care provider may also suggest exercises or physical therapy to strengthen your foot. Follow these instructions at home: If You Have a Bandage, Splint, or Walking Boot:  Wear it as directed by your health care provider. Remove it only as directed by your health care provider.  Loosen the bandage, splint, or walking boot if your toes become numb and tingle, or if they turn cold and blue. Bathing  If your health care provider approves bathing and showering, cover the bandage or splint with a watertight plastic bag to protect it from water. Do not let the bandage or splint get wet. Managing pain, stiffness, and swelling  If directed, apply ice to the injured area: ? Put ice in a plastic bag. ? Place a towel between your skin and the bag. ? Leave the ice on for 20 minutes, 2-3 times per day.  Move your toes often to avoid stiffness and to lessen  swelling.  Raise (elevate) the injured area above the level of your heart while you are sitting or lying down. Driving  Do not drive or operate heavy machinery while taking pain medicine.  Ask your health care provider when it is safe to drive if you have a bandage, splint, or walking boot on your foot. Activity  Rest as directed by your health care provider.  Do not use the injured foot to support your body weight until your health care provider says that you can. Use crutches or other supportive devices as directed by your health care provider.  Ask your health care provider what activities are safe for you. Gradually increase how much and how far you walk until your health care provider says it is safe to return to full activity.  Do any exercise or physical therapy as directed by your health care provider. General instructions  If a splint was applied, do not put pressure on any part of it until it is fully hardened. This may take several hours.  Take medicines only as directed by your health care provider. These include over-the-counter medicines and prescription medicines.  Keep all follow-up visits as directed by your health care provider. This is important.  When you can walk without pain, wear supportive shoes that have stiff soles. Do not wear flip-flops, and do not walk barefoot. Contact a health care provider if:  Your pain is not controlled with medicine.  Your bruising or swelling gets worse or does not get better with treatment.  Your splint or walking boot is damaged. Get help right away if:  You develop severe numbness or tingling in your foot.  Your foot turns blue, white, or gray, and it feels cold. This information is not intended to replace advice given to you by your health care provider. Make sure you discuss any questions you have with your health care provider. Document Released: 12/09/2001 Document Revised: 11/25/2015 Document Reviewed:  04/22/2014 Elsevier Interactive Patient Education  2018 Elsevier Inc.      Edwina Barth, MD [facility]

## 2017-05-14 NOTE — Patient Instructions (Addendum)
   IF you received an x-ray today, you will receive an invoice from [facility]. Please contact [facility] at [phone] with questions or concerns regarding your invoice.   IF you received labwork today, you will receive an invoice from [facility]. Please contact [facility] at [phone] with questions or concerns regarding your invoice.   Our billing staff will not be able to assist you with questions regarding bills from these companies.  You will be contacted with the lab results as soon as they are available. The fastest way to get your results is to activate your My Chart account. Instructions are located on the last page of this paperwork. If you have not heard from us regarding the results in 2 weeks, please contact this office.    Foot Sprain A foot sprain is an injury to one of the strong bands of tissue (ligaments) that connect and support the many bones in your feet. The ligament can be stretched too much or it can tear. A tear can be either partial or complete. The severity of the sprain depends on how much of the ligament was damaged or torn. What are the causes? A foot sprain is usually caused by suddenly twisting or pivoting your foot. What increases the risk? This injury is more likely to occur in people who:  Play a sport, such as basketball or football.  Exercise or play a sport without warming up.  Start a new workout or sport.  Suddenly increase how long or hard they exercise or play a sport.  What are the signs or symptoms? Symptoms of this condition start soon after an injury and include:  Pain, especially in the arch of the foot.  Bruising.  Swelling.  Inability to walk or use the foot to support body weight.  How is this diagnosed? This condition is diagnosed with a medical history and physical exam. You may also have imaging tests, such as:  X-rays to make sure there are no broken bones (fractures).  MRI to see if the ligament  has torn.  How is this treated? Treatment varies depending on the severity of your sprain. Mild sprains can be treated with rest, ice, compression, and elevation (RICE). If your ligament is overstretched or partially torn, treatment usually involves keeping your foot in a fixed position (immobilization) for a period of time. To help you do this, your health care provider will apply a bandage, splint, or walking boot to keep your foot from moving until it heals. You may also be advised to use crutches or a scooter for a few weeks to avoid bearing weight on your foot while it is healing. If your ligament is fully torn, you may need surgery to reconnect the ligament to the bone. After surgery, a cast or splint will be applied and will need to stay on your foot while it heals. Your health care provider may also suggest exercises or physical therapy to strengthen your foot. Follow these instructions at home: If You Have a Bandage, Splint, or Walking Boot:  Wear it as directed by your health care provider. Remove it only as directed by your health care provider.  Loosen the bandage, splint, or walking boot if your toes become numb and tingle, or if they turn cold and blue. Bathing  If your health care provider approves bathing and showering, cover the bandage or splint with a watertight plastic bag to protect it from water. Do not let the bandage or splint get wet. Managing pain,   stiffness, and swelling  If directed, apply ice to the injured area: ? Put ice in a plastic bag. ? Place a towel between your skin and the bag. ? Leave the ice on for 20 minutes, 2-3 times per day.  Move your toes often to avoid stiffness and to lessen swelling.  Raise (elevate) the injured area above the level of your heart while you are sitting or lying down. Driving  Do not drive or operate heavy machinery while taking pain medicine.  Ask your health care provider when it is safe to drive if you have a bandage,  splint, or walking boot on your foot. Activity  Rest as directed by your health care provider.  Do not use the injured foot to support your body weight until your health care provider says that you can. Use crutches or other supportive devices as directed by your health care provider.  Ask your health care provider what activities are safe for you. Gradually increase how much and how far you walk until your health care provider says it is safe to return to full activity.  Do any exercise or physical therapy as directed by your health care provider. General instructions  If a splint was applied, do not put pressure on any part of it until it is fully hardened. This may take several hours.  Take medicines only as directed by your health care provider. These include over-the-counter medicines and prescription medicines.  Keep all follow-up visits as directed by your health care provider. This is important.  When you can walk without pain, wear supportive shoes that have stiff soles. Do not wear flip-flops, and do not walk barefoot. Contact a health care provider if:  Your pain is not controlled with medicine.  Your bruising or swelling gets worse or does not get better with treatment.  Your splint or walking boot is damaged. Get help right away if:  You develop severe numbness or tingling in your foot.  Your foot turns blue, white, or gray, and it feels cold. This information is not intended to replace advice given to you by your health care provider. Make sure you discuss any questions you have with your health care provider. Document Released: 12/09/2001 Document Revised: 11/25/2015 Document Reviewed: 04/22/2014 Elsevier Interactive Patient Education  2018 Elsevier Inc.  

## 2017-08-31 ENCOUNTER — Ambulatory Visit: Payer: BLUE CROSS/BLUE SHIELD | Admitting: Adult Health

## 2017-08-31 ENCOUNTER — Other Ambulatory Visit: Payer: Self-pay

## 2017-08-31 ENCOUNTER — Emergency Department (HOSPITAL_COMMUNITY): Payer: BLUE CROSS/BLUE SHIELD

## 2017-08-31 ENCOUNTER — Encounter (HOSPITAL_COMMUNITY): Payer: Self-pay

## 2017-08-31 ENCOUNTER — Emergency Department (HOSPITAL_COMMUNITY)
Admission: EM | Admit: 2017-08-31 | Discharge: 2017-08-31 | Disposition: A | Discharge status: Home / Self Care | Payer: BLUE CROSS/BLUE SHIELD | Attending: Emergency Medicine | Admitting: Emergency Medicine

## 2017-08-31 ENCOUNTER — Encounter: Payer: Self-pay | Admitting: Adult Health

## 2017-08-31 VITALS — BP 144/93 | HR 106 | Temp 97.8°F | Resp 16 | Ht 64.0 in | Wt 187.0 lb

## 2017-08-31 DIAGNOSIS — R000 Tachycardia, unspecified: Secondary | ICD-10-CM

## 2017-08-31 DIAGNOSIS — R9431 Abnormal electrocardiogram [ECG] [EKG]: Secondary | ICD-10-CM

## 2017-08-31 DIAGNOSIS — I10 Essential (primary) hypertension: Secondary | ICD-10-CM | POA: Diagnosis not present

## 2017-08-31 DIAGNOSIS — Z789 Other specified health status: Secondary | ICD-10-CM

## 2017-08-31 DIAGNOSIS — Z79899 Other long term (current) drug therapy: Secondary | ICD-10-CM | POA: Insufficient documentation

## 2017-08-31 HISTORY — DX: Essential (primary) hypertension: I10

## 2017-08-31 LAB — I-STAT TROPONIN, ED: Troponin i, poc: 0 ng/mL (ref 0.00–0.08)

## 2017-08-31 LAB — BASIC METABOLIC PANEL
Anion gap: 9 (ref 5–15)
BUN: 6 mg/dL (ref 6–20)
CO2: 21 mmol/L — ABNORMAL LOW (ref 22–32)
Calcium: 9 mg/dL (ref 8.9–10.3)
Chloride: 110 mmol/L (ref 101–111)
Creatinine, Ser: 0.74 mg/dL (ref 0.44–1.00)
GFR calc Af Amer: >60 mL/min (ref >60)
GFR calc non Af Amer: >60 mL/min (ref >60)
Glucose, Bld: 98 mg/dL (ref 65–99)
Potassium: 3.8 mmol/L (ref 3.5–5.1)
Sodium: 140 mmol/L (ref 135–145)

## 2017-08-31 LAB — CBC
HCT: 44.7 % (ref 36.0–46.0)
Hemoglobin: 14.8 g/dL (ref 12.0–15.0)
MCH: 31.7 pg (ref 26.0–34.0)
MCHC: 33.1 g/dL (ref 30.0–36.0)
MCV: 95.7 fL (ref 78.0–100.0)
Platelets: 297 10*3/uL (ref 150–400)
RBC: 4.67 MIL/uL (ref 3.87–5.11)
RDW: 12.8 % (ref 11.5–15.5)
WBC: 7.9 10*3/uL (ref 4.0–10.5)

## 2017-08-31 LAB — I-STAT BETA HCG BLOOD, ED (MC, WL, AP ONLY): I-stat hCG, quantitative: <5 m[IU]/mL (ref <5)

## 2017-08-31 NOTE — ED Provider Notes (Signed)
[facility] EMERGENCY DEPARTMENT Provider Note   CSN: [id number] Arrival date & time: 08/31/17  1412     History   Chief Complaint Chief Complaint  Patient presents with  . Abnormal EKG  . Hypertension    HPI Margaret Rice is a 37 y.o. female.  Patient is a 37 year old female who went to see the eye doctor 2 weeks ago and at that time had a blood pressure check which was elevated.  She states things are very stressful at work right now but she is been trying to walk regularly and has lost 12 pounds.  She has been checking her blood pressure intermittently and is remained elevated.  She went to the clinic at her work today to see if there was anything that needed to be done about her hypertension as she has a follow-up appointment with her doctor in April.  She states they did an EKG and told her it was abnormal due to low voltage and that she should come here for further evaluation.  She denies any visual changes, headaches, chest pain, shortness of breath, unilateral numbness or weakness.  She has been trying to diet and exercise but has not been avoiding sodium.   The history is provided by the patient.  Hypertension  This is a new problem. Episode onset: Found out about it 2 weeks ago. The problem occurs constantly. The problem has not changed since onset.Pertinent negatives include no chest pain, no abdominal pain, no headaches and no shortness of breath. Nothing aggravates the symptoms. Nothing relieves the symptoms.    Past Medical History:  Diagnosis Date  . Hypertension   . Kidney stones     Patient Active Problem List   Diagnosis Date Noted  . Foot pain, right 05/14/2017  . Strain of right foot 05/14/2017  . Class 1 obesity due to excess calories without serious comorbidity with body mass index (BMI) of 34.0 to 34.9 in adult 07/31/2016  . Hirsutism 07/31/2016    Past Surgical History:  Procedure Laterality Date  . MOLE REMOVAL     BACK    OB  History    No data available       Home Medications    Prior to Admission medications   Medication Sig Start Date End Date Taking? Authorizing Provider  BALCOLTRA 0.1-20 MG-MCG(21) TABS  08/31/17   [provider]  desogestrel-ethinyl estradiol (VIORELE) 0.15-0.02/0.01 MG (21/5) tablet Take 1 tablet by mouth daily.    [provider]  Ferrous Sulfate (IRON SUPPLEMENT PO) Take by mouth daily.    [provider]  IBUPROFEN PO Take by mouth as needed.    [provider]  Levonorgestrel-Ethinyl Estradiol (AMETHIA,CAMRESE) 0.15-0.03 &0.01 MG tablet Take 1 tablet by mouth daily. 07/25/16   [name], MD  OVER THE COUNTER MEDICATION daily.    [provider]  OVER THE COUNTER MEDICATION daily.    [provider]  Prenatal Vit-Fe Fumarate-FA (PRENATAL VITAMIN PO) Take by mouth daily.    [provider]  TRETINOIN EX Apply topically as needed.    [provider]    Family History Family History  Problem Relation Age of Onset  . Hypertension Mother   . Hypertension Father   . Diverticulitis Father         PARTIAL COLON REMOVED  . Heart disease Maternal Grandmother   . Heart disease Paternal Grandmother     Social History Social History   Tobacco Use  . Smoking  status: Never Smoker  . Smokeless tobacco: Never Used  Substance Use Topics  . Alcohol use: No  . Drug use: No     Allergies   Patient has no known allergies.   Review of Systems Review of Systems  Respiratory: Negative for shortness of breath.   Cardiovascular: Negative for chest pain.  Gastrointestinal: Negative for abdominal pain.  Neurological: Negative for headaches.  All other systems reviewed and are negative.    Physical Exam Updated Vital Signs BP (!) 129/100   Pulse 85   Temp 99 F (37.2 C) (Oral)   Resp 17   Wt 84.8 kg (187 lb)   LMP 08/17/2017   SpO2 99%   BMI 32.10 kg/m   Physical Exam  Constitutional: She is  oriented to person, place, and time. She appears well-developed and well-nourished. No distress.  HENT:  Head: Normocephalic and atraumatic.  Mouth/Throat: Oropharynx is clear and moist.  Eyes: Conjunctivae and EOM are normal. Pupils are equal, round, and reactive to light.  Neck: Normal range of motion. Neck supple.  Cardiovascular: Normal rate, regular rhythm and intact distal pulses.  No murmur heard. Pulmonary/Chest: Effort normal and breath sounds normal. No respiratory distress. She has no wheezes. She has no rales.  Abdominal: Soft. She exhibits no distension. There is no tenderness. There is no rebound and no guarding.  Musculoskeletal: Normal range of motion. She exhibits no edema or tenderness.  Neurological: She is alert and oriented to person, place, and time.  Skin: Skin is warm and dry. No rash noted. No erythema.  Psychiatric: She has a normal mood and affect. Her behavior is normal.  Nursing note and vitals reviewed.    ED Treatments / Results  Labs (all labs ordered are listed, but only abnormal results are displayed) Labs Reviewed  BASIC METABOLIC PANEL - Abnormal; Notable for the following components:      Result Value   CO2 21 (*)    All other components within normal limits  CBC  I-STAT TROPONIN, ED  I-STAT BETA HCG BLOOD, ED ([facility], [facility], [facility] ONLY)    EKG  EKG Interpretation  Date/Time:  Friday August 31 2017 14:19:33 EST Ventricular Rate:  88 PR Interval:    QRS Duration: 84 QT Interval:  332 QTC Calculation: 401 R Axis:   52 Text Interpretation:  Sinus rhythm with sinus arrhythmia Normal ECG Confirmed by Gwyneth Sprout ([id number]) on 08/31/2017 6:56:54 PM       Radiology Dg Chest 2 View  Result Date: 08/31/2017 CLINICAL DATA:  Abnormal EKG. EXAM: CHEST  2 VIEW COMPARISON:  Radiographs of October 22, 2008. FINDINGS: The heart size and mediastinal contours are within normal limits. Both lungs are clear. No pneumothorax or pleural effusion is noted. The  visualized skeletal structures are unremarkable. IMPRESSION: No active cardiopulmonary disease. Electronically Signed   By: Lupita Raider, M.D.   On: 08/31/2017 14:54    Procedures Procedures (including critical care time)  Medications Ordered in ED Medications - No data to display   Initial Impression / Assessment and Plan / ED Course  I have reviewed the triage vital signs and the nursing notes.  Pertinent labs & imaging results that were available during my care of the patient were reviewed by me and considered in my medical decision making (see chart for details).     Patient presenting today with new onset hypertension discovered within the last few weeks.  Patient has no associated symptoms.  She had possible abnormal EKG at  work and they recommended she come here.  Looking at the work EKG she had low voltage in several leads but short of that her EKG was within normal limits.  Our EKG here shows sinus arrhythmia but otherwise a normal EKG.  Patient's blood pressures have ranged between 129-144 for systolic in the 90s-100 diastolic.  She does not meet criteria to start antihypertensives from the emergency room.  Recommended a low-salt diet, follow-up with PCP this month and return for symptoms of chest pain or shortness of breath.  Final Clinical Impressions(s) / ED Diagnoses   Final diagnoses:  Hypertension, unspecified type    ED Discharge Orders    None       Gwyneth SproutPlunkett, Pailynn Vahey, MD 08/31/17 [phone]

## 2017-08-31 NOTE — ED Triage Notes (Signed)
Pt presents to the ed with complaints of having elevated blood pressure, she went to her doctor today and was told to come here because her ekg was abnormal. Denies any chest pain or shortness of breath.

## 2017-08-31 NOTE — Patient Instructions (Addendum)
Hormonal Contraception Information Hormonal contraception is a type of birth control that uses hormones to prevent pregnancy. It usually involves a combination of the hormones estrogen and progesterone or only the hormone progesterone. Hormonal contraception works in these ways:  It thickens the mucus in the cervix, making it harder for sperm to enter the uterus.  It changes the lining of the uterus, making it harder for an egg to implant.  It may stop the ovaries from releasing eggs (ovulation). Some women who take hormonal contraceptives that contain only progesterone may continue to ovulate.  Hormonal contraception cannot prevent sexually transmitted infections (STIs). Pregnancy may still occur. Estrogen and progesterone contraceptives Contraceptives that use a combination of estrogen and progesterone are available in these forms:  Pill. Pills come in different combinations of hormones. They must be taken at the same time each day. Pills can affect your period, causing you to get your period once every three months or not at all.  Patch. The patch must be worn on the lower abdomen for three weeks and then removed on the fourth.  Vaginal ring. The ring is placed in the vagina and left there for three weeks. It is then removed for one week.  Progesterone contraceptives Contraceptives that use progesterone only are available in these forms:  Pill. Pills should be taken every day of the cycle.  Intrauterine device (IUD). This device is inserted into the uterus and removed or replaced every five years or sooner.  Implant. Plastic rods are placed under the skin of the upper arm. They are removed or replaced every three years or sooner.  Injection. The injection is given once every 90 days.  What are the side effects? The side effects of estrogen and progesterone contraceptives include:  Nausea.  Headaches.  Breast tenderness.  Bleeding or spotting between menstrual cycles.  High  blood pressure (rare).  Strokes, heart attacks, or blood clots (rare)  Side effects of progesterone-only contraceptives include:  Nausea.  Headaches.  Breast tenderness.  Unpredictable menstrual bleeding.  High blood pressure (rare).  Talk to your health care provider about what side effects may affect you. Where to find more information:  Ask your health care provider for more information and resources about hormonal contraception.  [place] Department of Health and [employer] Office on Women's Health: http://hoffman.com/www.womenshealth.gov Questions to ask:  What type of hormonal contraception is right for me?  How long should I plan to use hormonal contraception?  What are the side effects of the hormonal contraception method I choose?  How can I prevent STIs while using hormonal contraception? Contact a health care provider if:  You start taking hormonal contraceptives and you develop persistent or severe side effects. Summary  Estrogen and progesterone are hormones used in many forms of birth control.  Talk to your health care provider about what side effects may affect you.  Hormonal contraception cannot prevent sexually transmitted infections (STIs).  Ask your health care provider for more information and resources about hormonal contraception. This information is not intended to replace advice given to you by your health care provider. Make sure you discuss any questions you have with your health care provider. Document Released: 07/09/2007 Document Revised: 05/19/2016 Document Reviewed: 05/19/2016 Elsevier Interactive Patient Education  2018 [employer] Electrocardiography Electrocardiography is a test that records the electrical impulses of the heart. It assesses many aspects of heart health, including:  Heart function.  Heart rhythm.  Heart muscle thickness.  Electrocardiography can be done as a routine part  of a physical exam. It can also be done to evaluate  symptoms such as severe chest pain and heart palpitations. What happens during the procedure?  Electrocardiography is simple, safe, and painless. No electricity goes through your body during the procedure.  You will be asked to remove your clothes from the waist up and lie on your back for the test.  Sticky patches (electrodes) will be placed on your chest, arms, and legs. The electrodes will be attached by wires to the electrocardiography machine.  You will be asked to relax and lie still for a few seconds while the electrocardiography machine records the electrical activity of your heart. What happens after the procedure?  If electrocardiography is part of a routine physical exam, you may return to normal activities as told by your health care provider.  Your health care provider or a heart doctor (cardiologist) will interpret the recording.  The test result may not be available during your visit. If your test result is not back during the visit, make an appointment with your health care provider to find out the result. It is your responsibility to get your test results. This information is not intended to replace advice given to you by your health care provider. Make sure you discuss any questions you have with your health care provider. Document Released: 06/16/2000 Document Revised: 11/25/2015 Document Reviewed: 10/30/2011 Elsevier Interactive Patient Education  2017 [employer].

## 2017-08-31 NOTE — Progress Notes (Addendum)
Subjective:     Patient ID: Margaret Rice, female   DOB: April 21, 1981, 37 y.o.   MRN: [id number]  Hypertension  This is a new problem. The current episode started 1 to 4 weeks ago. The problem is unchanged. The problem is controlled. Associated symptoms include malaise/fatigue (" normal " no extra ) and palpitations (occasionally). Pertinent negatives include no anxiety, blurred vision, chest pain, headaches, neck pain, orthopnea, peripheral edema, PND, shortness of breath or sweats. Agents associated with hypertension include decongestants, estrogens, NSAIDs and oral contraceptives (she has taken sudafed/ mucinex/ Actifed alternating the month January. / NSAID she was taking for mood swings before her hormone therapy.). Risk factors for coronary artery disease include stress, family history and post-menopausal state (lost weight 12 lbs this year with walking and diet/ grandmother dads side CHF / father hypertension ). Improvement on treatment: none tried  There is no history of angina, kidney disease (kidney stone 10 years ago ), CAD/MI, CVA, heart failure, left ventricular hypertrophy, PVD or retinopathy.   Patient is a 37 year old female in no acute distress  Few weeks ago she noticed blood pressure was elevated at the eye doctor. She has been checking it regularly since then and having readings such as 146/103, heart rate 103 and other consistently high 90 diastolic or  above  reading.   She does report she has noticed occasional palpitations intermittently in the past month/  denies any at present. She reports fatigue though " no more than her usual".  She reports she has recently had a " flushed " pressure feeling radiate up to her neck.  She denied any other symptoms with these episodes and denies any at present. Denies any leg pain or swelling.   Patient  denies any fever, chills, rash, chest pain, shortness of breath, nausea, vomiting, or diarrhea.  Denies alcohol. Denies drug use.  Not  sleeping well for a few weeks she reports increased stress at work due to events and planning.   She reports GYN is Margaret Rice and appointment is in March 2019.  She reports Margaret Chick, MD is her PCP.  She has recently started a new combine birth control pill for her hormonal mood swings and that these are much improved- this was started 07/25/17 per Epic and patient history.  Her labs last drawn in January 2018 include normal TSH and CMP, CBC. Her yearly physical was due in January 2019 and she reports she will be calling to schedule.   Patient's last menstrual period was 08/17/2017.   Blood pressure (!) 144/93, pulse (!) 106, temperature 97.8 F (36.6 C), resp. rate 16, height 5\' 4"  (1.626 m), weight 187 lb (84.8 kg), last menstrual period 08/17/2017, SpO2 99 %. Recheck 146/96  Heart rate 108   No Known Allergies  Review of Systems  Constitutional: Positive for fatigue (" no more than usual". ) and malaise/fatigue (" normal " no extra ). Negative for activity change, appetite change, chills, diaphoresis, fever and unexpected weight change.  HENT: Negative.   Eyes: Negative.  Negative for blurred vision.  Respiratory: Negative.  Negative for apnea, cough, choking, chest tightness, shortness of breath, wheezing and stridor.   Cardiovascular: Positive for palpitations (occasionally). Negative for chest pain, orthopnea, leg swelling and PND.  Gastrointestinal: Negative.        Feels " bloated " at times in abdomen this fluctuates intermittently and she denies at this time.   Endocrine: Negative.   Genitourinary: Negative.   Musculoskeletal: Negative for  neck pain.  Skin: Negative.   Allergic/Immunologic:          Neurological: Negative.  Negative for dizziness, tremors, seizures, syncope, facial asymmetry, speech difficulty, weakness, light-headedness, numbness and headaches.  Hematological: Negative.   Psychiatric/Behavioral: Negative.        Objective:   Physical Exam   Constitutional: She is oriented to person, place, and time. She appears well-developed and well-nourished. No distress. She is not intubated.  HENT:  Head: Normocephalic and atraumatic.  Right Ear: Hearing normal.  Left Ear: Hearing normal.  Nose: Nose normal.  Mouth/Throat: Uvula is midline, oropharynx is clear and moist and mucous membranes are normal.  Eyes: Conjunctivae, EOM and lids are normal. Pupils are equal, round, and reactive to light. Right eye exhibits no discharge. Left eye exhibits no discharge. No scleral icterus.  Neck: Normal range of motion. Neck supple. No JVD present. No tracheal deviation present. No thyromegaly present.  Cardiovascular: Regular rhythm and intact distal pulses.  No extrasystoles are present. Tachycardia present. Exam reveals no gallop, no distant heart sounds and no friction rub.  No murmur heard. Tachycardia   PMI normal location anatomically.   Auscultate also in left lateral lying position.   No carotid or renal bruits auscultated bilaterally.    Pulmonary/Chest: Effort normal and breath sounds normal. No stridor. No apnea, no tachypnea and no bradypnea. She is not intubated.  Abdominal: Soft. Bowel sounds are normal. She exhibits no distension and no mass. There is no tenderness. There is no rebound and no guarding.  Musculoskeletal: Normal range of motion. She exhibits no edema, tenderness or deformity.  Lymphadenopathy:    She has no cervical adenopathy.  Neurological: She is alert and oriented to person, place, and time. She has normal strength and normal reflexes. She displays normal reflexes. No cranial nerve deficit or sensory deficit. She exhibits normal muscle tone. Coordination normal.  Skin: Skin is warm and dry. No rash noted. She is not diaphoretic. No erythema. No pallor.  Psychiatric: She has a normal mood and affect. Her speech is normal and behavior is normal. Judgment and thought content normal. Cognition and memory are normal.        Assessment:     Hypertension, unspecified type - Plan: EKG 12-Lead  Abnormal EKG  Uses birth control  Tachycardia with heart rate 100-120 beats per minute      Plan:     Advised office will call 911 to take her to the Emergency room for evaluation/ work up. AMA paper signed and witnessed by Armando Gangracy Greene RN as patient declined EMS and will self drive.   Abnormal EKG in office showing Low QRS voltage with possible septal infarct( age undetermined) - copy also given to patient by request and will be scanned under media in Epic. Unable to determine full QRS complex in leads. Nurse attempted x 3 strips all resulting in same / patient was still and no artifact present/  Work note given to not return to work until seen in ER and or evaluated by her PCP. Discussed possible differentials including but not limited to hypertension- uncontrolled, oral contraceptive induced hypertension since starting on 07/25/17, Pulmonary embolism, heart attack and discussed need for further evaluation given patients history and risk factors.   Patient verbalizes understanding of instructions, she declines EMS and [facility]. Discussed risks/ dangers and benefits of MVA, syncope, death while driving. Patient verbalized understanding of all instructions given and denies any further questions at this  time.   Follow up with your Margaret Chick, MD After Emergency Room visit.     Provider thoroughly discussed in collaboration above plan with supervising physician Dr. Julieanne Manson who is in agreement with the care plan as above.

## 2017-08-31 NOTE — ED Notes (Signed)
Pt reports going to the medical clinic at work today. While there, her blood pressure was elevated and she had a reported abnormal EKG. Pt denies any pain or headache.

## 2017-09-07 ENCOUNTER — Ambulatory Visit: Payer: BLUE CROSS/BLUE SHIELD | Admitting: Family Medicine

## 2017-09-07 ENCOUNTER — Encounter: Payer: Self-pay | Admitting: Family Medicine

## 2017-09-07 VITALS — BP 122/88 | HR 99 | Temp 98.0°F | Resp 16 | Ht 64.17 in | Wt 183.0 lb

## 2017-09-07 DIAGNOSIS — E282 Polycystic ovarian syndrome: Secondary | ICD-10-CM

## 2017-09-07 DIAGNOSIS — Z1321 Encounter for screening for nutritional disorder: Secondary | ICD-10-CM

## 2017-09-07 DIAGNOSIS — E6609 Other obesity due to excess calories: Secondary | ICD-10-CM

## 2017-09-07 DIAGNOSIS — R030 Elevated blood-pressure reading, without diagnosis of hypertension: Secondary | ICD-10-CM

## 2017-09-07 DIAGNOSIS — R000 Tachycardia, unspecified: Secondary | ICD-10-CM

## 2017-09-07 DIAGNOSIS — Z6831 Body mass index (BMI) 31.0-31.9, adult: Secondary | ICD-10-CM

## 2017-09-07 DIAGNOSIS — Z131 Encounter for screening for diabetes mellitus: Secondary | ICD-10-CM

## 2017-09-07 DIAGNOSIS — E7800 Pure hypercholesterolemia, unspecified: Secondary | ICD-10-CM

## 2017-09-07 DIAGNOSIS — F430 Acute stress reaction: Secondary | ICD-10-CM

## 2017-09-07 MED ORDER — METOPROLOL SUCCINATE ER 25 MG PO TB24: 25.0000 mg | ORAL_TABLET | Freq: Every day | ORAL | 1 refills | Status: DC

## 2017-09-07 NOTE — Progress Notes (Signed)
Subjective:    Patient ID: Margaret Rice, female    DOB: 1980-07-16, 37 y.o.   MRN: [id number]  09/07/2017  Hypertension (ER follow-up from 3/1 for HTN)    HPI This 37 y.o. female presents for ED follow-up for new onset hypertension. Lots of stress at work. Has lost weight. Eating well. Cutting out caffeine. Ophthalmologist checked BP and high. Father has meter; new batteries.  Father checking manually. 141/103, 138, 99, 152/98; 120/97, 118/87, 135/93   BP on 05/2017 124/68. 135/93, 144/101, 143/88, 129/100, 127/95, 128/104  Some heart palpitations. Some chest tightness.   Able to have the afternoon off.   Taking massages.  Slowing down and unwind.   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   Walking a lot; during lunch; walking for 15 minutes on breaks.   Has lost 218.  S/p consultation; [facility]; s/p pelvic us; had breakthrough on OCPs.  After a few months, lining nice and thin. Did diagnose with PCOS due to hirsutism.   Heart rate has been in low 100s.  Does not sleep very well at night.  Falls asleep at night; wakes up at 1200; sleeps at 900.  Wakes up again at 3:00.  Rolls into work at 700.   [facility] just switched patient two weeks ago.  Switched to [employer].    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   Really likes teaching.   NO FLU VACCINE THIS YEAR; REFUSES TODAY.   BP Readings from Last 3 Encounters:  09/07/17 122/88  08/31/17 (!) 129/100  08/31/17 (!) 144/93   Wt Readings from Last 3 Encounters:  09/07/17 183 lb (83 kg)  08/31/17 187 lb (84.8 kg)  08/31/17 187 lb (84.8 kg)   Immunization History  Administered Date(s) Administered  . Influenza-Unspecified 04/02/2016  . Tdap 07/25/2016    Review of Systems  Constitutional: Negative for chills, diaphoresis, fatigue and fever.  Eyes: Negative for visual disturbance.  Respiratory: Negative for cough and shortness of breath.   Cardiovascular: Negative for chest pain, palpitations  and leg swelling.  Gastrointestinal: Negative for abdominal pain, constipation, diarrhea, nausea and vomiting.  Endocrine: Negative for cold intolerance, heat intolerance, polydipsia, polyphagia and polyuria.  Neurological: Negative for dizziness, tremors, seizures, syncope, facial asymmetry, speech difficulty, weakness, light-headedness, numbness and headaches.    Past Medical History:  Diagnosis Date  . Hypertension   . Kidney stones    Past Surgical History:  Procedure Laterality Date  . MOLE REMOVAL     BACK   No Known Allergies Current Outpatient Medications on File Prior to Visit  Medication Sig Dispense Refill  . BALCOLTRA 0.1-20 MG-MCG(21) TABS   9  . Ferrous Sulfate (IRON SUPPLEMENT PO) Take by mouth daily.    . IBUPROFEN PO Take by mouth as needed.    Marland Kitchen. OVER THE COUNTER MEDICATION daily.    Marland Kitchen. OVER THE COUNTER MEDICATION daily.     No current facility-administered medications on file prior to visit.    Social History   Socioeconomic History  . Marital status: Single    Spouse name: Not on file  . Number of children: Not on file  . Years of education: Not on file  . Highest education level: Not on file  Occupational History  . Not on file  Social Needs  . Financial resource strain: Not on file  . Food insecurity:    Worry: Not on file    Inability: Not on file  . Transportation needs:  Medical: Not on file    Non-medical: Not on file  Tobacco Use  . Smoking status: Never Smoker  . Smokeless tobacco: Never Used  Substance and Sexual Activity  . Alcohol use: No  . Drug use: No  . Sexual activity: Not on file  Lifestyle  . Physical activity:    Days per week: Not on file    Minutes per session: Not on file  . Stress: Not on file  Relationships  . Social connections:    Talks on phone: Not on file    Gets together: Not on file    Attends religious service: Not on file    Active member of club or organization: Not on file    Attends meetings of  clubs or organizations: Not on file    Relationship status: Not on file  . Intimate partner violence:    Fear of current or ex partner: Not on file    Emotionally abused: Not on file    Physically abused: Not on file    Forced sexual activity: Not on file  Other Topics Concern  . Not on file  Social History Narrative  . Not on file   Family History  Problem Relation Age of Onset  . Hypertension Mother   . Hypertension Father   . Diverticulitis Father         PARTIAL COLON REMOVED  . Heart disease Maternal Grandmother   . Heart disease Paternal Grandmother        Objective:    BP 122/88   Pulse 99   Temp 98 F (36.7 C) (Oral)   Resp 16   Ht 5' 4.17" (1.63 m)   Wt 183 lb (83 kg)   LMP 08/17/2017   SpO2 99%   BMI 31.24 kg/m  Physical Exam  Constitutional: She is oriented to person, place, and time. She appears well-developed and well-nourished. No distress.  HENT:  Head: Normocephalic and atraumatic.  Right Ear: External ear normal.  Left Ear: External ear normal.  Nose: Nose normal.  Mouth/Throat: Oropharynx is clear and moist.  Eyes: Pupils are equal, round, and reactive to light. Conjunctivae and EOM are normal.  Neck: Normal range of motion. Neck supple. Carotid bruit is not present. No thyromegaly present.  Cardiovascular: Normal rate, regular rhythm, normal heart sounds and intact distal pulses. Exam reveals no gallop and no friction rub.  No murmur heard. Pulmonary/Chest: Effort normal and breath sounds normal. She has no wheezes. She has no rales.  Abdominal: Soft. Bowel sounds are normal. She exhibits no distension and no mass. There is no tenderness. There is no rebound and no guarding.  Lymphadenopathy:    She has no cervical adenopathy.  Neurological: She is alert and oriented to person, place, and time. No cranial nerve deficit.  Skin: Skin is warm and dry. No rash noted. She is not diaphoretic. No erythema. No pallor.  Psychiatric: She has a normal  mood and affect. Her behavior is normal.   No results found. Depression screen [facility] 2/9 09/07/2017 05/14/2017 07/25/2016  Decreased Interest 0 0 0  Down, Depressed, Hopeless 0 0 0  PHQ - 2 Score 0 0 0   Fall Risk  09/07/2017 05/14/2017 07/25/2016  Falls in the past year? No No No        Assessment & Plan:   1. Elevated blood pressure reading   2. Tachycardia   3. Pure hypercholesterolemia   4. Screening for diabetes mellitus   5. PCOS (polycystic ovarian  syndrome)   6. Encounter for vitamin deficiency screening   7. Class 1 obesity due to excess calories with serious comorbidity and body mass index (BMI) of 31.0 to 31.9 in adult   8. Stress reaction     New onset elevated blood pressure readings: Associated with recent stressors.  Obtain labs to rule out secondary causes.  Repeat EKG today.  Initiate metoprolol ER 25 mg 1 tablet daily.  Continue with exercise and weight loss.  PCOS: s/p gynecology consultation confirming PCOS.  Recommend weight loss, exercise for 30-60 minutes five days per week; recommend 1200 kcal restriction per day with a minimum of 60 grams of protein per day.  Stress Reaction: recommend exercise for stress management.    Orders Placed This Encounter  Procedures  . Comprehensive metabolic panel    Order Specific Question:   Has the patient fasted?    Answer:   No  . Hemoglobin A1c  . Vitamin D, 25-hydroxy  . Lipid panel    Order Specific Question:   Has the patient fasted?    Answer:   No  . TSH  . T4, Free  . Urinalysis, dipstick only  . Urinalysis, dipstick only  . EKG 12-Lead   Meds ordered this encounter  Medications  . metoprolol succinate (TOPROL-XL) 25 MG 24 hr tablet    Sig: Take 1 tablet (25 mg total) by mouth at bedtime.    Dispense:  90 tablet    Refill:  1    Return in about 2 months (around 11/07/2017) for recheck blood pressure.   Benedicto Capozzi Paulita Fujita, M.D. Primary Care at [facility] [address] [place] [address], [place]  [address] [phone] phone [phone] fax

## 2017-09-07 NOTE — Patient Instructions (Addendum)
   IF you received an x-ray today, you will receive an invoice from [facility]. Please contact [facility] at [phone] with questions or concerns regarding your invoice.   IF you received labwork today, you will receive an invoice from [facility]. Please contact [facility] at [phone] with questions or concerns regarding your invoice.   Our billing staff will not be able to assist you with questions regarding bills from these companies.  You will be contacted with the lab results as soon as they are available. The fastest way to get your results is to activate your My Chart account. Instructions are located on the last page of this paperwork. If you have not heard from us regarding the results in 2 weeks, please contact this office.      Managing Your Hypertension Hypertension is commonly called high blood pressure. This is when the force of your blood pressing against the walls of your arteries is too strong. Arteries are blood vessels that carry blood from your heart throughout your body. Hypertension forces the heart to work harder to pump blood, and may cause the arteries to become narrow or stiff. Having untreated or uncontrolled hypertension can cause heart attack, stroke, kidney disease, and other problems. What are blood pressure readings? A blood pressure reading consists of a higher number over a lower number. Ideally, your blood pressure should be below 120/80. The first ("top") number is called the systolic pressure. It is a measure of the pressure in your arteries as your heart beats. The second ("bottom") number is called the diastolic pressure. It is a measure of the pressure in your arteries as the heart relaxes. What does my blood pressure reading mean? Blood pressure is classified into four stages. Based on your blood pressure reading, your health care provider may use the following stages to determine what type of treatment you need, if any. Systolic  pressure and diastolic pressure are measured in a unit called mm Hg. Normal  Systolic pressure: below 120.  Diastolic pressure: below 80. Elevated  Systolic pressure: 120-129.  Diastolic pressure: below 80. Hypertension stage 1  Systolic pressure: 130-139.  Diastolic pressure: 80-89. Hypertension stage 2  Systolic pressure: 140 or above.  Diastolic pressure: 90 or above. What health risks are associated with hypertension? Managing your hypertension is an important responsibility. Uncontrolled hypertension can lead to:  A heart attack.  A stroke.  A weakened blood vessel (aneurysm).  Heart failure.  Kidney damage.  Eye damage.  Metabolic syndrome.  Memory and concentration problems.  What changes can I make to manage my hypertension? Hypertension can be managed by making lifestyle changes and possibly by taking medicines. Your health care provider will help you make a plan to bring your blood pressure within a normal range. Eating and drinking  Eat a diet that is high in fiber and potassium, and low in salt (sodium), added sugar, and fat. An example eating plan is called the DASH (Dietary Approaches to Stop Hypertension) diet. To eat this way: ? Eat plenty of fresh fruits and vegetables. Try to fill half of your plate at each meal with fruits and vegetables. ? Eat whole grains, such as whole wheat pasta, brown rice, or whole grain bread. Fill about one quarter of your plate with whole grains. ? Eat low-fat diary products. ? Avoid fatty cuts of meat, processed or cured meats, and poultry with skin. Fill about one quarter of your plate with lean proteins such as fish, chicken without skin, beans, eggs,   and tofu. ? Avoid premade and processed foods. These tend to be higher in sodium, added sugar, and fat.  Reduce your daily sodium intake. Most people with hypertension should eat less than 1,500 mg of sodium a day.  Limit alcohol intake to no more than 1 drink a day  for nonpregnant women and 2 drinks a day for men. One drink equals 12 oz of beer, 5 oz of wine, or 1 oz of hard liquor. Lifestyle  Work with your health care provider to maintain a healthy body weight, or to lose weight. Ask what an ideal weight is for you.  Get at least 30 minutes of exercise that causes your heart to beat faster (aerobic exercise) most days of the week. Activities may include walking, swimming, or biking.  Include exercise to strengthen your muscles (resistance exercise), such as weight lifting, as part of your weekly exercise routine. Try to do these types of exercises for 30 minutes at least 3 days a week.  Do not use any products that contain nicotine or tobacco, such as cigarettes and e-cigarettes. If you need help quitting, ask your health care provider.  Control any long-term (chronic) conditions you have, such as high cholesterol or diabetes. Monitoring  Monitor your blood pressure at home as told by your health care provider. Your personal target blood pressure may vary depending on your medical conditions, your age, and other factors.  Have your blood pressure checked regularly, as often as told by your health care provider. Working with your health care provider  Review all the medicines you take with your health care provider because there may be side effects or interactions.  Talk with your health care provider about your diet, exercise habits, and other lifestyle factors that may be contributing to hypertension.  Visit your health care provider regularly. Your health care provider can help you create and adjust your plan for managing hypertension. Will I need medicine to control my blood pressure? Your health care provider may prescribe medicine if lifestyle changes are not enough to get your blood pressure under control, and if:  Your systolic blood pressure is 130 or higher.  Your diastolic blood pressure is 80 or higher.  Take medicines only as told  by your health care provider. Follow the directions carefully. Blood pressure medicines must be taken as prescribed. The medicine does not work as well when you skip doses. Skipping doses also puts you at risk for problems. Contact a health care provider if:  You think you are having a reaction to medicines you have taken.  You have repeated (recurrent) headaches.  You feel dizzy.  You have swelling in your ankles.  You have trouble with your vision. Get help right away if:  You develop a severe headache or confusion.  You have unusual weakness or numbness, or you feel faint.  You have severe pain in your chest or abdomen.  You vomit repeatedly.  You have trouble breathing. Summary  Hypertension is when the force of blood pumping through your arteries is too strong. If this condition is not controlled, it may put you at risk for serious complications.  Your personal target blood pressure may vary depending on your medical conditions, your age, and other factors. For most people, a normal blood pressure is less than 120/80.  Hypertension is managed by lifestyle changes, medicines, or both. Lifestyle changes include weight loss, eating a healthy, low-sodium diet, exercising more, and limiting alcohol. This information is not intended to replace advice   given to you by your health care provider. Make sure you discuss any questions you have with your health care provider. Document Released: 03/13/2012 Document Revised: 05/17/2016 Document Reviewed: 05/17/2016 Elsevier Interactive Patient Education  2018 Else[employer] Inc.  

## 2017-09-08 LAB — COMPREHENSIVE METABOLIC PANEL
ALT: 15 IU/L (ref 0–32)
AST: 11 IU/L (ref 0–40)
Albumin/Globulin Ratio: 1.6 (ref 1.2–2.2)
Albumin: 4.1 g/dL (ref 3.5–5.5)
Alkaline Phosphatase: 68 IU/L (ref 39–117)
BUN/Creatinine Ratio: 10 (ref 9–23)
BUN: 7 mg/dL (ref 6–20)
Bilirubin Total: 0.9 mg/dL (ref 0.0–1.2)
CO2: 23 mmol/L (ref 20–29)
Calcium: 9.2 mg/dL (ref 8.7–10.2)
Chloride: 106 mmol/L (ref 96–106)
Creatinine, Ser: 0.69 mg/dL (ref 0.57–1.00)
GFR calc Af Amer: 127 mL/min/{1.73_m2} (ref >59)
GFR calc non Af Amer: 110 mL/min/{1.73_m2} (ref >59)
Globulin, Total: 2.6 g/dL (ref 1.5–4.5)
Glucose: 82 mg/dL (ref 65–99)
Potassium: 4.8 mmol/L (ref 3.5–5.2)
Sodium: 142 mmol/L (ref 134–144)
Total Protein: 6.7 g/dL (ref 6.0–8.5)

## 2017-09-08 LAB — HEMOGLOBIN A1C
Est. average glucose Bld gHb Est-mCnc: 100 mg/dL
Hgb A1c MFr Bld: 5.1 % (ref 4.8–5.6)

## 2017-09-08 LAB — URINALYSIS, DIPSTICK ONLY
Bilirubin, UA: NEGATIVE
Glucose, UA: NEGATIVE
Leukocytes, UA: NEGATIVE
Nitrite, UA: NEGATIVE
Protein, UA: NEGATIVE
RBC, UA: NEGATIVE
Specific Gravity, UA: 1.014 (ref 1.005–1.030)
Urobilinogen, Ur: 0.2 mg/dL (ref 0.2–1.0)
pH, UA: 6.5 (ref 5.0–7.5)

## 2017-09-08 LAB — LIPID PANEL
Chol/HDL Ratio: 2.9 ratio (ref 0.0–4.4)
Cholesterol, Total: 153 mg/dL (ref 100–199)
HDL: 52 mg/dL (ref >39)
LDL Calculated: 90 mg/dL (ref 0–99)
Triglycerides: 57 mg/dL (ref 0–149)
VLDL Cholesterol Cal: 11 mg/dL (ref 5–40)

## 2017-09-08 LAB — T4, FREE: Free T4: 1.43 ng/dL (ref 0.82–1.77)

## 2017-09-08 LAB — VITAMIN D 25 HYDROXY (VIT D DEFICIENCY, FRACTURES): Vit D, 25-Hydroxy: 9.9 ng/mL — ABNORMAL LOW (ref 30.0–100.0)

## 2017-09-08 LAB — TSH: TSH: 0.647 u[IU]/mL (ref 0.450–4.500)

## 2017-09-26 ENCOUNTER — Other Ambulatory Visit: Payer: BLUE CROSS/BLUE SHIELD

## 2017-11-08 ENCOUNTER — Ambulatory Visit: Payer: BLUE CROSS/BLUE SHIELD | Admitting: Urgent Care

## 2017-11-08 ENCOUNTER — Encounter: Payer: Self-pay | Admitting: Urgent Care

## 2017-11-08 VITALS — BP 128/87 | HR 94 | Temp 98.3°F | Resp 16 | Ht 64.0 in | Wt 179.0 lb

## 2017-11-08 DIAGNOSIS — J3489 Other specified disorders of nose and nasal sinuses: Secondary | ICD-10-CM | POA: Diagnosis not present

## 2017-11-08 DIAGNOSIS — B9789 Other viral agents as the cause of diseases classified elsewhere: Secondary | ICD-10-CM | POA: Diagnosis not present

## 2017-11-08 DIAGNOSIS — R52 Pain, unspecified: Secondary | ICD-10-CM | POA: Diagnosis not present

## 2017-11-08 DIAGNOSIS — J069 Acute upper respiratory infection, unspecified: Secondary | ICD-10-CM | POA: Diagnosis not present

## 2017-11-08 MED ORDER — BENZONATATE 100 MG PO CAPS: 100.0000 mg | ORAL_CAPSULE | Freq: Three times a day (TID) | ORAL | 0 refills | Status: DC | PRN

## 2017-11-08 MED ORDER — PSEUDOEPHEDRINE HCL ER 120 MG PO TB12
120.0000 mg | ORAL_TABLET | Freq: Two times a day (BID) | ORAL | 3 refills | Status: DC
Start: 2017-11-08 — End: 2017-12-17

## 2017-11-08 MED ORDER — HYDROCODONE-HOMATROPINE 5-1.5 MG/5ML PO SYRP: 5.0000 mL | ORAL_SOLUTION | Freq: Every evening | ORAL | 0 refills | Status: DC | PRN

## 2017-11-08 NOTE — Patient Instructions (Signed)
Hydrate well with at least 2 liters (1 gallon) of water daily. For sore throat try using a honey-based tea. Use 3 teaspoons of honey with juice squeezed from half lemon. Place shaved pieces of ginger into 1/2-1 cup of water and warm over stove top. Then mix the ingredients and repeat every 4 hours as needed.      Viral Respiratory Infection A respiratory infection is an illness that affects part of the respiratory system, such as the lungs, nose, or throat. Most respiratory infections are caused by either viruses or bacteria. A respiratory infection that is caused by a virus is called a viral respiratory infection. Common types of viral respiratory infections include:  A cold.  The flu (influenza).  A respiratory syncytial virus (RSV) infection.  How do I know if I have a viral respiratory infection? Most viral respiratory infections cause:  A stuffy or runny nose.  Yellow or green nasal discharge.  A cough.  Sneezing.  Fatigue.  Achy muscles.  A sore throat.  Sweating or chills.  A fever.  A headache.  How are viral respiratory infections treated? If influenza is diagnosed early, it may be treated with an antiviral medicine that shortens the length of time a person has symptoms. Symptoms of viral respiratory infections may be treated with over-the-counter and prescription medicines, such as:  Expectorants. These make it easier to cough up mucus.  Decongestant nasal sprays.  Health care providers do not prescribe antibiotic medicines for viral infections. This is because antibiotics are designed to kill bacteria. They have no effect on viruses. How do I know if I should stay home from work or school? To avoid exposing others to your respiratory infection, stay home if you have:  A fever.  A persistent cough.  A sore throat.  A runny nose.  Sneezing.  Muscles aches.  Headaches.  Fatigue.  Weakness.  Chills.  Sweating.  Nausea.  Follow these  instructions at home:  Rest as much as possible.  Take over-the-counter and prescription medicines only as told by your health care provider.  Drink enough fluid to keep your urine clear or pale yellow. This helps prevent dehydration and helps loosen up mucus.  Gargle with a salt-water mixture 3-4 times per day or as needed. To make a salt-water mixture, completely dissolve -1 tsp of salt in 1 cup of warm water.  Use nose drops made from salt water to ease congestion and soften raw skin around your nose.  Do not drink alcohol.  Do not use tobacco products, including cigarettes, chewing tobacco, and e-cigarettes. If you need help quitting, ask your health care provider. Contact a health care provider if:  Your symptoms last for 10 days or longer.  Your symptoms get worse over time.  You have a fever.  You have severe sinus pain in your face or forehead.  The glands in your jaw or neck become very swollen. Get help right away if:  You feel pain or pressure in your chest.  You have shortness of breath.  You faint or feel like you will faint.  You have severe and persistent vomiting.  You feel confused or disoriented. This information is not intended to replace advice given to you by your health care provider. Make sure you discuss any questions you have with your health care provider. Document Released: 03/29/2005 Document Revised: 11/25/2015 Document Reviewed: 11/25/2014 Elsevier Interactive Patient Education  [employer].

## 2017-11-08 NOTE — Progress Notes (Signed)
    MRN: [id number] DOB: [birth year]  Subjective:   Margaret Rice is a 37 y.o. female presenting for 2 day history of sinus pain, sinus congestion, sore throat, dry cough, chest congestion, body aches, sneezing. Has tried benadryl, APAP, Vitamin C. Denies history of allergies. Works in Development worker, community. Denies smoking cigarettes. Denies history of asthma.  Denies fever, chest pain, nausea, vomiting, belly pain, rashes.  Margaret Rice has a current medication list which includes the following prescription(s): balcoltra, ferrous sulfate, and metoprolol succinate. Also has No Known Allergies.  Margaret Rice  has a past medical history of Hypertension and Kidney stones. Also  has a past surgical history that includes Mole removal.  Objective:   Vitals: BP 128/87   Pulse 94   Temp 98.3 F (36.8 C) (Oral)   Resp 16   Ht  (1.626 m)   Wt 179 lb (81.2 kg)   SpO2 98%   BMI 30.73 kg/m   BP Readings from Last 3 Encounters:  11/08/17 128/87  09/07/17 122/88  08/31/17 (!) 129/100    Physical Exam  Constitutional: She is oriented to person, place, and time. She appears well-developed and well-nourished.  HENT:  TMs clear and intact.  Bilateral maxillary sinus tenderness.  Nasal mucosa is dry, nasal passages patent.  Throat clear without exudates.  Eyes: Right eye exhibits no discharge. Left eye exhibits no discharge. No scleral icterus.  Cardiovascular: Normal rate, regular rhythm and intact distal pulses. Exam reveals no gallop and no friction rub.  No murmur heard. Pulmonary/Chest: No respiratory distress. She has no wheezes. She has no rales.  Neurological: She is alert and oriented to person, place, and time.  Skin: Skin is warm and dry.  Psychiatric: She has a normal mood and affect.   Assessment and Plan :   Viral URI with cough  Sinus pain  Body aches  We will manage supportively for viral type illness.  Offered her strong cough suppression medications.  Recommended she use  pseudoephedrine for her congestion sinus pain. Counseled patient on potential for adverse effects with medications prescribed today, patient verbalized understanding. Return-to-clinic precautions discussed, patient verbalized understanding.  Wallis Bamberg, PA-C Primary Care at [facility] [phone] 11/08/2017  5:37 PM

## 2017-11-10 ENCOUNTER — Encounter: Payer: Self-pay | Admitting: Urgent Care

## 2017-11-12 ENCOUNTER — Encounter: Payer: Self-pay | Admitting: Family Medicine

## 2017-11-12 ENCOUNTER — Ambulatory Visit: Payer: BLUE CROSS/BLUE SHIELD | Admitting: Family Medicine

## 2017-11-12 ENCOUNTER — Other Ambulatory Visit: Payer: Self-pay

## 2017-11-12 VITALS — BP 118/82 | HR 93 | Temp 98.7°F | Resp 16 | Ht 64.57 in | Wt 174.0 lb

## 2017-11-12 DIAGNOSIS — E282 Polycystic ovarian syndrome: Secondary | ICD-10-CM | POA: Diagnosis not present

## 2017-11-12 DIAGNOSIS — E559 Vitamin D deficiency, unspecified: Secondary | ICD-10-CM | POA: Diagnosis not present

## 2017-11-12 DIAGNOSIS — I10 Essential (primary) hypertension: Secondary | ICD-10-CM

## 2017-11-12 DIAGNOSIS — R002 Palpitations: Secondary | ICD-10-CM | POA: Diagnosis not present

## 2017-11-12 MED ORDER — METOPROLOL SUCCINATE ER 50 MG PO TB24: 50.0000 mg | ORAL_TABLET | Freq: Every day | ORAL | 1 refills | Status: DC

## 2017-11-12 NOTE — Progress Notes (Signed)
Subjective:    Patient ID: Margaret Rice, female    DOB: May 14, 1981, 37 y.o.   MRN: [id number]  11/12/2017  Hypertension (3 month follow-up) and URI    HPI This 37 y.o. female presents for TWO MONTH FOLLOW-UP of new onset hypertension, vitamin D deficiency.  Management changes made at last visit include the following: -New onset elevated blood pressure readings: Associated with recent stressors.  Obtain labs to rule out secondary causes.  Repeat EKG today.  Initiate metoprolol ER 25 mg 1 tablet daily.  Continue with exercise and weight loss. -PCOS: s/p gynecology consultation confirming PCOS. -Recommend weight loss, exercise for 30-60 minutes five days per week; recommend 1200 kcal restriction per day with a minimum of 60 grams of protein per day. -Stress Reaction: recommend exercise for stress management.   Labs all normal except vitamin D of 9.9.  UPDATE: Tolerating Metoprolol; switched to morning to help remain calm.  Less anxious during the day. Used Unisom for a while without much benefit; going to bed later at 10:00; was going 9:00-9:30; might have been too early. Still having palpitations. Walking daily at lunch. Uusually with sitting. Feels in throat. Flutter is brief. Discussed OCP with gynecologist. Started taking vitamin D supplement one gel pill [employer].    BP Readings from Last 3 Encounters:  01/09/18 98/62  12/17/17 113/75  11/12/17 118/82   Wt Readings from Last 3 Encounters:  01/09/18 181 lb (82.1 kg)  12/17/17 179 lb 12 oz (81.5 kg)  11/12/17 174 lb (78.9 kg)   Immunization History  Administered Date(s) Administered  . Influenza-Unspecified 04/02/2016  . Tdap 07/25/2016    Review of Systems  Constitutional: Negative for activity change, appetite change, chills, diaphoresis, fatigue, fever and unexpected weight change.  HENT: Negative for congestion, dental problem, drooling, ear discharge, ear pain, facial swelling, hearing loss, mouth sores,  nosebleeds, postnasal drip, rhinorrhea, sinus pressure, sneezing, sore throat, tinnitus, trouble swallowing and voice change.   Eyes: Negative for photophobia, pain, discharge, redness, itching and visual disturbance.  Respiratory: Negative for apnea, cough, choking, chest tightness, shortness of breath, wheezing and stridor.   Cardiovascular: Positive for palpitations. Negative for chest pain and leg swelling.  Gastrointestinal: Negative for abdominal distention, abdominal pain, anal bleeding, blood in stool, constipation, diarrhea, nausea, rectal pain and vomiting.  Endocrine: Negative for cold intolerance, heat intolerance, polydipsia, polyphagia and polyuria.  Genitourinary: Negative for decreased urine volume, difficulty urinating, dyspareunia, dysuria, enuresis, flank pain, frequency, genital sores, hematuria, menstrual problem, pelvic pain, urgency, vaginal bleeding, vaginal discharge and vaginal pain.  Musculoskeletal: Negative for arthralgias, back pain, gait problem, joint swelling, myalgias, neck pain and neck stiffness.  Skin: Negative for color change, pallor, rash and wound.  Allergic/Immunologic: Negative for environmental allergies, food allergies and immunocompromised state.  Neurological: Negative for dizziness, tremors, seizures, syncope, facial asymmetry, speech difficulty, weakness, light-headedness, numbness and headaches.  Hematological: Negative for adenopathy. Does not bruise/bleed easily.  Psychiatric/Behavioral: Negative for agitation, behavioral problems, confusion, decreased concentration, dysphoric mood, hallucinations, self-injury, sleep disturbance and suicidal ideas. The patient is nervous/anxious. The patient is not hyperactive.     Past Medical History:  Diagnosis Date  . Hypertension   . Kidney stones    Past Surgical History:  Procedure Laterality Date  . MOLE REMOVAL     BACK   No Known Allergies Current Outpatient Medications on File Prior to Visit    Medication Sig Dispense Refill  . BALCOLTRA 0.1-20 MG-MCG(21) TABS   9  . Ferrous Sulfate (  IRON SUPPLEMENT PO) Take by mouth daily.     No current facility-administered medications on file prior to visit.    Social History   Socioeconomic History  . Marital status: Single    Spouse name: Not on file  . Number of children: Not on file  . Years of education: Not on file  . Highest education level: Not on file  Occupational History  . Not on file  Social Needs  . Financial resource strain: Not on file  . Food insecurity:    Worry: Not on file    Inability: Not on file  . Transportation needs:    Medical: Not on file    Non-medical: Not on file  Tobacco Use  . Smoking status: Never Smoker  . Smokeless tobacco: Never Used  Substance and Sexual Activity  . Alcohol use: No  . Drug use: No  . Sexual activity: Not on file  Lifestyle  . Physical activity:    Days per week: Not on file    Minutes per session: Not on file  . Stress: Not on file  Relationships  . Social connections:    Talks on phone: Not on file    Gets together: Not on file    Attends religious service: Not on file    Active member of club or organization: Not on file    Attends meetings of clubs or organizations: Not on file    Relationship status: Not on file  . Intimate partner violence:    Fear of current or ex partner: Not on file    Emotionally abused: Not on file    Physically abused: Not on file    Forced sexual activity: Not on file  Other Topics Concern  . Not on file  Social History Narrative  . Not on file   Family History  Problem Relation Age of Onset  . Hypertension Mother   . Hypertension Father   . Diverticulitis Father         PARTIAL COLON REMOVED  . Heart disease Maternal Grandmother   . Heart disease Paternal Grandmother        Objective:    BP 118/82   Pulse 93   Temp 98.7 F (37.1 C) (Oral)   Resp 16   Ht 5' 4.57" (1.64 m)   Wt 174 lb (78.9 kg)   SpO2 97%   BMI  29.34 kg/m  Physical Exam  Constitutional: She is oriented to person, place, and time. She appears well-developed and well-nourished. No distress.  HENT:  Head: Normocephalic and atraumatic.  Eyes: Pupils are equal, round, and reactive to light. Conjunctivae are normal.  Neck: Normal range of motion. Neck supple.  Cardiovascular: Normal rate, regular rhythm and normal heart sounds. Exam reveals no gallop and no friction rub.  No murmur heard. Pulmonary/Chest: Effort normal and breath sounds normal. She has no wheezes. She has no rales.  Neurological: She is alert and oriented to person, place, and time.  Skin: She is not diaphoretic.  Psychiatric: She has a normal mood and affect. Her behavior is normal. Judgment and thought content normal.  Nursing note and vitals reviewed.  No results found. Depression screen [facility] 2/9 01/09/2018 11/12/2017 09/07/2017 05/14/2017 07/25/2016  Decreased Interest 0 0 0 0 0  Down, Depressed, Hopeless 0 0 0 0 0  PHQ - 2 Score 0 0 0 0 0   Fall Risk  01/09/2018 11/12/2017 09/07/2017 05/14/2017 07/25/2016  Falls in the past year? No No No No  No        Assessment & Plan:   1. Essential hypertension   2. Vitamin D deficiency   3. PCOS (polycystic ovarian syndrome)   4. Palpitations     Palpitations: Persistent.  Refer to cardiology for further evaluation.  Increase metoprolol to 50 mg daily.  Hypertension: Improved with metoprolol therapy.  Increase metoprolol to 50 mg daily.  PCOS: Stable at this time.  Managed by gynecology.  Vitamin D deficiency: Uncontrolled/new.  Patient has initiated a daily vitamin supplementation.  Will warrant repeat labs at next visit.  Orders Placed This Encounter  Procedures  . Ambulatory referral to Cardiology    Referral Priority:   Routine    Referral Type:   Consultation    Referral Reason:   Specialty Services Required    Requested Specialty:   Cardiology    Number of Visits Requested:   1   Meds ordered this  encounter  Medications  . metoprolol succinate (TOPROL-XL) 50 MG 24 hr tablet    Sig: Take 1 tablet (50 mg total) by mouth at bedtime.    Dispense:  90 tablet    Refill:  1    Return in about 6 weeks (around 12/24/2017) for recheck vitamin D, blood pressure.   Ranell Finelli Paulita Fujita, M.D. Primary [facility] at [facility] [address] [place], [place]  [address] [phone] phone [phone] fax

## 2017-11-12 NOTE — Patient Instructions (Addendum)
   IF you received an x-ray today, you will receive an invoice from [facility]. Please contact [facility] at [phone] with questions or concerns regarding your invoice.   IF you received labwork today, you will receive an invoice from [facility]. Please contact [facility] at [phone] with questions or concerns regarding your invoice.   Our billing staff will not be able to assist you with questions regarding bills from these companies.  You will be contacted with the lab results as soon as they are available. The fastest way to get your results is to activate your My Chart account. Instructions are located on the last page of this paperwork. If you have not heard from us regarding the results in 2 weeks, please contact this office.      Managing Your Hypertension Hypertension is commonly called high blood pressure. This is when the force of your blood pressing against the walls of your arteries is too strong. Arteries are blood vessels that carry blood from your heart throughout your body. Hypertension forces the heart to work harder to pump blood, and may cause the arteries to become narrow or stiff. Having untreated or uncontrolled hypertension can cause heart attack, stroke, kidney disease, and other problems. What are blood pressure readings? A blood pressure reading consists of a higher number over a lower number. Ideally, your blood pressure should be below 120/80. The first ("top") number is called the systolic pressure. It is a measure of the pressure in your arteries as your heart beats. The second ("bottom") number is called the diastolic pressure. It is a measure of the pressure in your arteries as the heart relaxes. What does my blood pressure reading mean? Blood pressure is classified into four stages. Based on your blood pressure reading, your health care provider may use the following stages to determine what type of treatment you need, if any. Systolic  pressure and diastolic pressure are measured in a unit called mm Hg. Normal  Systolic pressure: below 120.  Diastolic pressure: below 80. Elevated  Systolic pressure: 120-129.  Diastolic pressure: below 80. Hypertension stage 1  Systolic pressure: 130-139.  Diastolic pressure: 80-89. Hypertension stage 2  Systolic pressure: 140 or above.  Diastolic pressure: 90 or above. What health risks are associated with hypertension? Managing your hypertension is an important responsibility. Uncontrolled hypertension can lead to:  A heart attack.  A stroke.  A weakened blood vessel (aneurysm).  Heart failure.  Kidney damage.  Eye damage.  Metabolic syndrome.  Memory and concentration problems.  What changes can I make to manage my hypertension? Hypertension can be managed by making lifestyle changes and possibly by taking medicines. Your health care provider will help you make a plan to bring your blood pressure within a normal range. Eating and drinking  Eat a diet that is high in fiber and potassium, and low in salt (sodium), added sugar, and fat. An example eating plan is called the DASH (Dietary Approaches to Stop Hypertension) diet. To eat this way: ? Eat plenty of fresh fruits and vegetables. Try to fill half of your plate at each meal with fruits and vegetables. ? Eat whole grains, such as whole wheat pasta, brown rice, or whole grain bread. Fill about one quarter of your plate with whole grains. ? Eat low-fat diary products. ? Avoid fatty cuts of meat, processed or cured meats, and poultry with skin. Fill about one quarter of your plate with lean proteins such as fish, chicken without skin, beans, eggs,   and tofu. ? Avoid premade and processed foods. These tend to be higher in sodium, added sugar, and fat.  Reduce your daily sodium intake. Most people with hypertension should eat less than 1,500 mg of sodium a day.  Limit alcohol intake to no more than 1 drink a day  for nonpregnant women and 2 drinks a day for men. One drink equals 12 oz of beer, 5 oz of wine, or 1 oz of hard liquor. Lifestyle  Work with your health care provider to maintain a healthy body weight, or to lose weight. Ask what an ideal weight is for you.  Get at least 30 minutes of exercise that causes your heart to beat faster (aerobic exercise) most days of the week. Activities may include walking, swimming, or biking.  Include exercise to strengthen your muscles (resistance exercise), such as weight lifting, as part of your weekly exercise routine. Try to do these types of exercises for 30 minutes at least 3 days a week.  Do not use any products that contain nicotine or tobacco, such as cigarettes and e-cigarettes. If you need help quitting, ask your health care provider.  Control any long-term (chronic) conditions you have, such as high cholesterol or diabetes. Monitoring  Monitor your blood pressure at home as told by your health care provider. Your personal target blood pressure may vary depending on your medical conditions, your age, and other factors.  Have your blood pressure checked regularly, as often as told by your health care provider. Working with your health care provider  Review all the medicines you take with your health care provider because there may be side effects or interactions.  Talk with your health care provider about your diet, exercise habits, and other lifestyle factors that may be contributing to hypertension.  Visit your health care provider regularly. Your health care provider can help you create and adjust your plan for managing hypertension. Will I need medicine to control my blood pressure? Your health care provider may prescribe medicine if lifestyle changes are not enough to get your blood pressure under control, and if:  Your systolic blood pressure is 130 or higher.  Your diastolic blood pressure is 80 or higher.  Take medicines only as told  by your health care provider. Follow the directions carefully. Blood pressure medicines must be taken as prescribed. The medicine does not work as well when you skip doses. Skipping doses also puts you at risk for problems. Contact a health care provider if:  You think you are having a reaction to medicines you have taken.  You have repeated (recurrent) headaches.  You feel dizzy.  You have swelling in your ankles.  You have trouble with your vision. Get help right away if:  You develop a severe headache or confusion.  You have unusual weakness or numbness, or you feel faint.  You have severe pain in your chest or abdomen.  You vomit repeatedly.  You have trouble breathing. Summary  Hypertension is when the force of blood pumping through your arteries is too strong. If this condition is not controlled, it may put you at risk for serious complications.  Your personal target blood pressure may vary depending on your medical conditions, your age, and other factors. For most people, a normal blood pressure is less than 120/80.  Hypertension is managed by lifestyle changes, medicines, or both. Lifestyle changes include weight loss, eating a healthy, low-sodium diet, exercising more, and limiting alcohol. This information is not intended to replace advice   given to you by your health care provider. Make sure you discuss any questions you have with your health care provider. Document Released: 03/13/2012 Document Revised: 05/17/2016 Document Reviewed: 05/17/2016 Elsevier Interactive Patient Education  2018 [employer]  

## 2017-11-22 ENCOUNTER — Encounter: Payer: Self-pay | Admitting: Family Medicine

## 2017-11-27 ENCOUNTER — Ambulatory Visit: Payer: Self-pay

## 2017-11-27 NOTE — Telephone Encounter (Signed)
Call --- 1.  Patient see in the office on 11/12/17 where we increased her Metoprolol from  to  daily.  What is heart rate/pulse on higher dose of Metoprolol.  What is ranges of blood pressure (lowest blood pressure since 11/13/17-now; highest blood pressure since 11/13/17.   2.  If chest pain lasts beyond 10-15 seconds with each episode, please have patient present to ED.  If continues to have sharp shooting pains that last a couple of seconds and occurs at rest, not likely heart related.  3. When were these elevated diastolic readings?  What had she been doing right before checking BP?

## 2017-11-27 NOTE — Telephone Encounter (Signed)
Pt calling with c/o occasional elevated diastolic BP to 100-110. Today her BP is 132/85.  Pt also c/o intermittent chest pain, located upper chest between the breasts and lasts a few seconds at a time. No radiation to neck, back, jaw or arm.  Pt states that she has h/o heart palpitations and is being referred to a cardiologist. Appt 12/17/17.  Pt seeking advice on her elevated diastolic BP. She is asking if she needs another dose increase on her Metoprolol, or if she needs another med or does she need to stop her birth control.  Reason for Disposition . Chest pain(s) lasting a few seconds  Answer Assessment - Initial Assessment Questions 1. LOCATION: "Where does it hurt?"       Center of chest between breasts 2. RADIATION: "Does the pain go anywhere else?" (e.g., into neck, jaw, arms, back)     no 3. ONSET: "When did the chest pain begin?" (Minutes, hours or days)      Saturday 4. PATTERN "Does the pain come and go, or has it been constant since it started?"  "Does it get worse with exertion?"      Comes and goes usually happens when sitting 5. DURATION: "How long does it last" (e.g., seconds, minutes, hours)     Few seconds 2-3 times per day 6. SEVERITY: "How bad is the pain?"  (e.g., Scale 1-10; mild, moderate, or severe)    - MILD (1-3): doesn't interfere with normal activities     - MODERATE (4-7): interferes with normal activities or awakens from sleep    - SEVERE (8-10): excruciating pain, unable to do any normal activities       mild 7. CARDIAC RISK FACTORS: "Do you have any history of heart problems or risk factors for heart disease?" (e.g., prior heart attack, angina; high blood pressure, diabetes, being overweight, high cholesterol, smoking, or strong family history of heart disease)     High blood pressure overweight 8. PULMONARY RISK FACTORS: "Do you have any history of lung disease?"  (e.g., blood clots in lung, asthma, emphysema, birth control pills)     borth control  pills 9. CAUSE: "What do you think is causing the chest pain?"     Stress from job  10. OTHER SYMPTOMS: "Do you have any other symptoms?" (e.g., dizziness, nausea, vomiting, sweating, fever, difficulty breathing, cough)       Yesterday felt lightheaded- none today 11. PREGNANCY: "Is there any chance you are pregnant?" "When was your last menstrual period?"       No LMP: on menses  Protocols used: CHEST PAIN-A-AH

## 2017-11-28 NOTE — Telephone Encounter (Signed)
Spoke with patient advised per Dr. Katrinka Blazing to monitor BP and to call let us know if it consistently stays above 90 Diastolic to call us.   Advise on Chest pain precautions when to go to ER  .   Patient has an appointment with Cardiology on 6-17 Patient voiced understanding

## 2017-12-06 ENCOUNTER — Other Ambulatory Visit: Payer: Self-pay | Admitting: Family Medicine

## 2017-12-17 ENCOUNTER — Encounter: Payer: Self-pay | Admitting: Cardiovascular Disease

## 2017-12-17 ENCOUNTER — Encounter: Payer: Self-pay | Admitting: *Deleted

## 2017-12-17 ENCOUNTER — Ambulatory Visit: Payer: BLUE CROSS/BLUE SHIELD | Admitting: Cardiovascular Disease

## 2017-12-17 VITALS — BP 113/75 | HR 78 | Ht 64.0 in | Wt 179.8 lb

## 2017-12-17 DIAGNOSIS — I10 Essential (primary) hypertension: Secondary | ICD-10-CM

## 2017-12-17 DIAGNOSIS — R002 Palpitations: Secondary | ICD-10-CM

## 2017-12-17 NOTE — Progress Notes (Signed)
Cardiology Office Note  Date:  12/17/2017   ID:  [name], DOB [birth year], MRN [id number]  PCP:  Margaret ChickSmith, Kristi M, MD   Chief Complaint  Patient presents with  . OTHER    F/u ED chest pain, heart palpitations, Hypertension and sob. Meds reviewed verbally with pt.    HPI:  This 37 y.o. female presents for   hypertension,  Palpitations PCOS.  vitamin D of 9.9. Work stress Who presents by referral from Dr. Nilda SimmerKristi Smith for consultation of her palpitations, hypertension  She reports working at [employer], does [employer] Significant work stress, long hours Has started exercise program, walks daily Watching her diet more closely Over the past year with 20 pound weight loss  For symptoms of palpitations started on metoprolol succinate 25 mg dose increased up to 50 mg dose in the morning with improved palpitations and blood pressure She presents with her today a page her blood pressure numbers and heart rate measurements Systolic pressure typically 110 up to 130, heart rate 70-80  Some difficulty sleeping at times depending on her stressors In general palpitations are better  Margaret Grahamurry taking low-dose vitamin D over-the-counter supplement  EKG personally reviewed by myself on todays visit Shows normal sinus rhythm with rate 78 bpm no significant ST or T-wave changes    Father with HTN, Mother no significant cardiac history   PMH:   has a past medical history of Hypertension and Kidney stones.  PSH:    Past Surgical History:  Procedure Laterality Date  . MOLE REMOVAL     BACK    Current Outpatient Medications  Medication Sig Dispense Refill  . ACETAMINOPHEN PO Take by mouth as needed.    Marland Kitchen. BALCOLTRA 0.1-20 MG-MCG(21) TABS   9  . Cholecalciferol (VITAMIN D) 2000 units CAPS Take by mouth daily.    . Ferrous Sulfate (IRON SUPPLEMENT PO) Take by mouth daily.    . metoprolol succinate (TOPROL-XL) 50 MG 24 hr tablet Take 1 tablet (50 mg total) by mouth at bedtime. 90  tablet 1  . Multiple Vitamins-Minerals (ICAPS AREDS 2 PO) Take by mouth daily.    . psyllium (REGULOID) 0.52 g capsule Take 2 capsules by mouth daily.     No current facility-administered medications for this visit.      Allergies:   Patient has no known allergies.   Social History:  The patient  reports that she has never smoked. She has never used smokeless tobacco. She reports that she does not drink alcohol or use drugs.   Family History:   family history includes Diverticulitis in her father; Heart disease in her maternal grandmother and paternal grandmother; Hypertension in her father and mother.    Review of Systems: Review of Systems  Constitutional: Negative.   Respiratory: Negative.   Cardiovascular: Positive for palpitations.  Gastrointestinal: Negative.   Musculoskeletal: Negative.   Neurological: Negative.   Psychiatric/Behavioral: Negative.   All other systems reviewed and are negative.    PHYSICAL EXAM: VS:  BP 113/75 (BP Location: Right Arm, Patient Position: Sitting, Cuff Size: Normal)   Pulse 78   Ht 5\' 4"  (1.626 Rice)   Wt 179 lb 12 oz (81.5 kg)   BMI 30.85 kg/Rice  , BMI Body mass index is 30.85 kg/Rice. GEN: Well nourished, well developed, in no acute distress  HEENT: normal  Neck: no JVD, carotid bruits, or masses Cardiac: RRR; no murmurs, rubs, or gallops,no edema  Respiratory:  clear to auscultation bilaterally, normal work of breathing GI:  soft, nontender, nondistended, + BS MS: no deformity or atrophy  Skin: warm and dry, no rash Neuro:  Strength and sensation are intact Psych: euthymic mood, full affect    Recent Labs: 08/31/2017: Hemoglobin 14.8; Platelets 297 09/07/2017: ALT 15; BUN 7; Creatinine, Ser 0.69; Potassium 4.8; Sodium 142; TSH 0.647    Lipid Panel Lab Results  Component Value Date   CHOL 153 09/07/2017   HDL 52 09/07/2017   LDLCALC 90 09/07/2017   TRIG 57 09/07/2017      Wt Readings from Last 3 Encounters:  12/17/17 179 lb 12  oz (81.5 kg)  11/12/17 174 lb (78.9 kg)  11/08/17 179 lb (81.2 kg)      ASSESSMENT AND PLAN:  Palpitations Likely having APCs, unable to screwed other arrhythmia such as short runs of SVT or atrial tachycardia. Symptoms seem to have improved on metoprolol succinate 50 mg in the morning We did offer short acting propranolol for any breakthrough arrhythmia. She will call us if she would like a prescription. We have also offered a long-term monitor if she has recurrent symptoms (2 week zio) She can call our office if she would like this scheduled Otherwise recommended she continue her current medications  Benign essential HTN - Plan: EKG 12-Lead Blood pressure well controlled on review of her numbers over the past several months No medication changes recommended  No significant cardiac risk factors Nonsmoker, nondiabetic, cholesterol is excellent on no medications No significant family history  Disposition:   F/U  As needed   Total encounter time more than 60 minutes  Greater than 50% was spent in counseling and coordination of care with the patient  Patient was seenon consultation for Dr. Nilda Rice he'll be referred back to her office for ongoing care of issues detailed above   No orders of the defined types were placed in this encounter.    Signed, Dossie Arbour, Rice.D., Ph.D. 12/17/2017  [facility] [place], [place] [phone]

## 2017-12-17 NOTE — Patient Instructions (Signed)

## 2017-12-18 ENCOUNTER — Encounter: Payer: Self-pay | Admitting: Cardiovascular Disease

## 2018-01-09 ENCOUNTER — Other Ambulatory Visit: Payer: Self-pay

## 2018-01-09 ENCOUNTER — Encounter: Payer: Self-pay | Admitting: Family Medicine

## 2018-01-09 ENCOUNTER — Ambulatory Visit: Payer: BLUE CROSS/BLUE SHIELD | Admitting: Family Medicine

## 2018-01-09 VITALS — BP 98/62 | HR 88 | Temp 98.9°F | Resp 16 | Ht 63.98 in | Wt 181.0 lb

## 2018-01-09 DIAGNOSIS — I10 Essential (primary) hypertension: Secondary | ICD-10-CM | POA: Diagnosis not present

## 2018-01-09 DIAGNOSIS — E559 Vitamin D deficiency, unspecified: Secondary | ICD-10-CM | POA: Diagnosis not present

## 2018-01-09 DIAGNOSIS — E282 Polycystic ovarian syndrome: Secondary | ICD-10-CM | POA: Diagnosis not present

## 2018-01-09 DIAGNOSIS — R002 Palpitations: Secondary | ICD-10-CM

## 2018-01-09 NOTE — Patient Instructions (Addendum)
  Decrease Metoprolol 50mg  1/2 tablet daily for 1-2 weeks.  If blood pressure less than 120/70, stop Metoprolol. Check blood pressure daily for the next month.   IF you received an x-ray today, you will receive an invoice from [facility]. Please contact [facility] at [phone] with questions or concerns regarding your invoice.   IF you received labwork today, you will receive an invoice from [facility]. Please contact [facility] at [phone] with questions or concerns regarding your invoice.   Our billing staff will not be able to assist you with questions regarding bills from these companies.  You will be contacted with the lab results as soon as they are available. The fastest way to get your results is to activate your My Chart account. Instructions are located on the last page of this paperwork. If you have not heard from us regarding the results in 2 weeks, please contact this office.

## 2018-01-09 NOTE — Progress Notes (Signed)
Subjective:    Patient ID: Margaret CannyRobin Barman, female    DOB: Jan 07, 1981, 37 y.o.   MRN: [id number]  01/09/2018  Hypertension (3 month follow-up ) and Vitamin D    HPI This 37 y.o. female presents for two month follow-up evaluation of hypertension, vitamin D deficiency.  Management changes made last visit include the following:  S/p cardiology consultation by Dr. Mariah MillingGollan on 12/17/17.  Prescribed propanolol for PRN palpitations.  Offered a two week monitor if symptoms recur.   Follow-up PRN.  UPDATE: Changed jobs since last visit. Stress much improved. Palpitations improved. Gave notice on 12/20/17; gave two week notice. Felt lightheaded yesterday so only took 1/2 Metoprolol last night. Has not been checking BP regularly.   Blood pressure cuff at home.  Working in [employer].   Vitamin D 2000 IU daily. Gel capsule.   BP Readings from Last 3 Encounters:  01/09/18 98/62  12/17/17 113/75  11/12/17 118/82   Wt Readings from Last 3 Encounters:  01/09/18 181 lb (82.1 kg)  12/17/17 179 lb 12 oz (81.5 kg)  11/12/17 174 lb (78.9 kg)   Immunization History  Administered Date(s) Administered  . Influenza-Unspecified 04/02/2016  . Tdap 07/25/2016    Review of Systems  Constitutional: Negative for activity change, appetite change, chills, diaphoresis, fatigue, fever and unexpected weight change.  HENT: Negative for congestion, dental problem, drooling, ear discharge, ear pain, facial swelling, hearing loss, mouth sores, nosebleeds, postnasal drip, rhinorrhea, sinus pressure, sneezing, sore throat, tinnitus, trouble swallowing and voice change.   Eyes: Negative for photophobia, pain, discharge, redness, itching and visual disturbance.  Respiratory: Negative for apnea, cough, choking, chest tightness, shortness of breath, wheezing and stridor.   Cardiovascular: Negative for chest pain, palpitations and leg swelling.  Gastrointestinal: Negative for abdominal distention, abdominal pain, anal  bleeding, blood in stool, constipation, diarrhea, nausea, rectal pain and vomiting.  Endocrine: Negative for cold intolerance, heat intolerance, polydipsia, polyphagia and polyuria.  Genitourinary: Negative for decreased urine volume, difficulty urinating, dyspareunia, dysuria, enuresis, flank pain, frequency, genital sores, hematuria, menstrual problem, pelvic pain, urgency, vaginal bleeding, vaginal discharge and vaginal pain.  Musculoskeletal: Negative for arthralgias, back pain, gait problem, joint swelling, myalgias, neck pain and neck stiffness.  Skin: Negative for color change, pallor, rash and wound.  Allergic/Immunologic: Negative for environmental allergies, food allergies and immunocompromised state.  Neurological: Negative for dizziness, tremors, seizures, syncope, facial asymmetry, speech difficulty, weakness, light-headedness, numbness and headaches.  Hematological: Negative for adenopathy. Does not bruise/bleed easily.  Psychiatric/Behavioral: Negative for agitation, behavioral problems, confusion, decreased concentration, dysphoric mood, hallucinations, self-injury, sleep disturbance and suicidal ideas. The patient is not nervous/anxious and is not hyperactive.     Past Medical History:  Diagnosis Date  . Hypertension   . Kidney stones    Past Surgical History:  Procedure Laterality Date  . MOLE REMOVAL     BACK   No Known Allergies Current Outpatient Medications on File Prior to Visit  Medication Sig Dispense Refill  . ACETAMINOPHEN PO Take by mouth as needed.    Marland Kitchen. BALCOLTRA 0.1-20 MG-MCG(21) TABS   9  . Cholecalciferol (VITAMIN D) 2000 units CAPS Take by mouth daily.    . Ferrous Sulfate (IRON SUPPLEMENT PO) Take by mouth daily.    . metoprolol succinate (TOPROL-XL) 50 MG 24 hr tablet Take 1 tablet (50 mg total) by mouth at bedtime. 90 tablet 1  . Multiple Vitamins-Minerals (ICAPS AREDS 2) CAPS Take by mouth.    . Multiple Vitamins-Minerals (PRESERVISION AREDS 2) CAPS  Take by mouth.     No current facility-administered medications on file prior to visit.    Social History   Socioeconomic History  . Marital status: Single    Spouse name: Not on file  . Number of children: Not on file  . Years of education: Not on file  . Highest education level: Not on file  Occupational History  . Not on file  Social Needs  . Financial resource strain: Not on file  . Food insecurity:    Worry: Not on file    Inability: Not on file  . Transportation needs:    Medical: Not on file    Non-medical: Not on file  Tobacco Use  . Smoking status: Never Smoker  . Smokeless tobacco: Never Used  Substance and Sexual Activity  . Alcohol use: No  . Drug use: No  . Sexual activity: Not on file  Lifestyle  . Physical activity:    Days per week: Not on file    Minutes per session: Not on file  . Stress: Not on file  Relationships  . Social connections:    Talks on phone: Not on file    Gets together: Not on file    Attends religious service: Not on file    Active member of club or organization: Not on file    Attends meetings of clubs or organizations: Not on file    Relationship status: Not on file  . Intimate partner violence:    Fear of current or ex partner: Not on file    Emotionally abused: Not on file    Physically abused: Not on file    Forced sexual activity: Not on file  Other Topics Concern  . Not on file  Social History Narrative  . Not on file   Family History  Problem Relation Age of Onset  . Hypertension Mother   . Hypertension Father   . Diverticulitis Father         PARTIAL COLON REMOVED  . Heart disease Maternal Grandmother   . Heart disease Paternal Grandmother        Objective:    BP 98/62   Pulse 88   Temp 98.9 F (37.2 C) (Oral)   Resp 16   Ht 5' 3.98" (1.625 m)   Wt 181 lb (82.1 kg)   SpO2 98%   BMI 31.09 kg/m  Physical Exam  Constitutional: She is oriented to person, place, and time. She appears well-developed and  well-nourished. No distress.  HENT:  Head: Normocephalic and atraumatic.  Right Ear: External ear normal.  Left Ear: External ear normal.  Nose: Nose normal.  Mouth/Throat: Oropharynx is clear and moist.  Eyes: Pupils are equal, round, and reactive to light. Conjunctivae and EOM are normal.  Neck: Normal range of motion. Neck supple. Carotid bruit is not present. No thyromegaly present.  Cardiovascular: Normal rate, regular rhythm, normal heart sounds and intact distal pulses. Exam reveals no gallop and no friction rub.  No murmur heard. Pulmonary/Chest: Effort normal and breath sounds normal. She has no wheezes. She has no rales.  Abdominal: Soft. Bowel sounds are normal. She exhibits no distension and no mass. There is no tenderness. There is no rebound and no guarding.  Lymphadenopathy:    She has no cervical adenopathy.  Neurological: She is alert and oriented to person, place, and time. No cranial nerve deficit.  Skin: Skin is warm and dry. No rash noted. She is not diaphoretic. No erythema. No pallor.  Psychiatric: She has a normal mood and affect. Her behavior is normal. Judgment and thought content normal.   No results found. Depression screen [facility] 2/9 01/09/2018 11/12/2017 09/07/2017 05/14/2017 07/25/2016  Decreased Interest 0 0 0 0 0  Down, Depressed, Hopeless 0 0 0 0 0  PHQ - 2 Score 0 0 0 0 0   Fall Risk  01/09/2018 11/12/2017 09/07/2017 05/14/2017 07/25/2016  Falls in the past year? No No No No No        Assessment & Plan:   1. Essential hypertension   2. Vitamin D deficiency   3. Palpitations   4. PCOS (polycystic ovarian syndrome)     Hypertension with palpitations: Much improved with transitional jobs.  Stress has significantly decreased.  Recommend decreasing metoprolol 50 mg to 1/2 tablet daily for 1 to 2 weeks.  If blood pressure remains less than 120 over 70s, stop metoprolol therapy.  Check blood pressure daily for the next month.  Vitamin D deficiency:  Uncontrolled.  Patient tolerating daily vitamin D supplementation.  Obtain labs to evaluate levels.  PCOS: Stable at this time.  Recommend ongoing weight loss, exercise, low sugar and low-cholesterol food choices.  Orders Placed This Encounter  Procedures  . VITAMIN D 25 Hydroxy (Vit-D Deficiency, Fractures)  . Basic metabolic panel   No orders of the defined types were placed in this encounter.   Return in about 3 months (around 04/11/2018) for follow-up chronic medical conditions SANT[place]AGO.   Isabellah Sobocinski Paulita Fujita, M.D. Primary Care at [facility] [address] [place], [place]  [address] [phone] phone [phone] fax

## 2018-01-10 LAB — BASIC METABOLIC PANEL
BUN/Creatinine Ratio: 13 (ref 9–23)
BUN: 9 mg/dL (ref 6–20)
CO2: 23 mmol/L (ref 20–29)
Calcium: 9.5 mg/dL (ref 8.7–10.2)
Chloride: 105 mmol/L (ref 96–106)
Creatinine, Ser: 0.72 mg/dL (ref 0.57–1.00)
GFR calc Af Amer: 122 mL/min/{1.73_m2} (ref >59)
GFR calc non Af Amer: 106 mL/min/{1.73_m2} (ref >59)
Glucose: 94 mg/dL (ref 65–99)
Potassium: 4.8 mmol/L (ref 3.5–5.2)
Sodium: 140 mmol/L (ref 134–144)

## 2018-01-10 LAB — VITAMIN D 25 HYDROXY (VIT D DEFICIENCY, FRACTURES): Vit D, 25-Hydroxy: 30.9 ng/mL (ref 30.0–100.0)

## 2018-01-15 ENCOUNTER — Other Ambulatory Visit: Payer: Self-pay | Admitting: Family Medicine

## 2018-01-24 ENCOUNTER — Telehealth: Payer: Self-pay | Admitting: Family Medicine

## 2018-01-24 NOTE — Telephone Encounter (Signed)
Copied from CRM [id number]. Topic: Quick Communication - See Telephone Encounter >> Jan 24, 2018  2:28 PM Arlyss Gandyichardson, Effa Yarrow N, NT wrote: CRM for notification. See Telephone encounter for: 01/24/18. Pt states that pharmacy rejected the refill for her metoprolol succinate (TOPROL-XL) 50 MG 24 hr tablet due to the medication is now suppose to be for 25mg  per patient. Please advise. [id number] [place], [place] - [address] [phone] (Phone) [phone] (Fax)

## 2018-01-30 MED ORDER — METOPROLOL SUCCINATE ER 25 MG PO TB24: 25.0000 mg | ORAL_TABLET | Freq: Every day | ORAL | 0 refills | Status: DC

## 2018-01-30 NOTE — Telephone Encounter (Signed)
Dr santiago please advise. Dgaddy, CMA 

## 2018-01-30 NOTE — Telephone Encounter (Signed)
Please let patient know that I refilled metoprolol 25mg  for 30 days with no refills as Dr Michaelle CopasSmith's note states that if her BP continued to be less than 120/70 to stop metoprolol altogether. She can call for refills if needed before her next appt with me in October. Thank you I Leretha PolSantiago, MD

## 2018-01-31 NOTE — Telephone Encounter (Signed)
Lm on cell.

## 2018-02-08 ENCOUNTER — Telehealth: Payer: Self-pay | Admitting: Family Medicine

## 2018-02-08 NOTE — Telephone Encounter (Signed)
Left a voicemail for Ms. Margaret Rice in regards to her appt she has on 04/11/2018. The provider will not be in the office on that day and would need to be rescheduled.

## 2018-02-22 ENCOUNTER — Other Ambulatory Visit: Payer: Self-pay | Admitting: Family Medicine

## 2018-02-22 NOTE — Telephone Encounter (Signed)
Patient called, left VM to return call to the office to speak to a nurse to give BP readings. It was noted in the last OV note 01/09/18 to check BP daily for the next month. Dr. Leretha PolSantiago noted to call back for refills if needed before 04/12/18 appointment.  Metoprolol refill Last Refill:01/24/18 #30/0 by Leretha PolSantiago Last OV: 01/09/18; Upcoming 04/12/18 PCP: [facility]: [id number] [place], [place] - [address] [phone] (Phone) [phone] (Fax)

## 2018-02-22 NOTE — Telephone Encounter (Signed)
Pt returning call to Nurse.  Please call pt: [phone]    Pt states she would prefer to stay on metoprolol succinate (TOPROL-XL) 25 MG 24 hr tablet  BP Readings on average while on 25mg  dose:  115/80 118/86   Pt states she cut tab in half and  took 12.5mg  for a while and her BP readings were:   130/92 136/98  Pt states HR was also elevated when on 12.5mg  dose.

## 2018-03-17 ENCOUNTER — Other Ambulatory Visit: Payer: Self-pay | Admitting: Family Medicine

## 2018-04-02 ENCOUNTER — Telehealth: Payer: Self-pay | Admitting: Family Medicine

## 2018-04-02 ENCOUNTER — Other Ambulatory Visit: Payer: Self-pay

## 2018-04-02 MED ORDER — METOPROLOL SUCCINATE ER 25 MG PO TB24: ORAL_TABLET | ORAL | 0 refills | Status: DC

## 2018-04-02 NOTE — Telephone Encounter (Signed)
Copied from CRM [id number]. Topic: General - Other >> Apr 02, 2018 12:54 PM Margaret Rice wrote: Reason for CRM: Patient called to say that she want to stay on the metoprolol succinate (TOPROL-XL) 25 MG 24 hr tablet . She know that there are no refills on it and that it is not due until the 16th but due to changing her appointment until 04/23/18 she will not be able to speak with the provider about this but would like her to know that she is ok with the 25mg  for now. Please advise

## 2018-04-02 NOTE — Telephone Encounter (Signed)
Please call patient

## 2018-04-02 NOTE — Telephone Encounter (Signed)
Rx has been sent to pharmacy. Called pt and informed

## 2018-04-10 ENCOUNTER — Other Ambulatory Visit: Payer: Self-pay | Admitting: Family Medicine

## 2018-04-11 ENCOUNTER — Ambulatory Visit: Payer: BLUE CROSS/BLUE SHIELD | Admitting: Family Medicine

## 2018-04-12 ENCOUNTER — Ambulatory Visit: Payer: BLUE CROSS/BLUE SHIELD | Admitting: Family Medicine

## 2018-04-23 ENCOUNTER — Other Ambulatory Visit: Payer: Self-pay

## 2018-04-23 ENCOUNTER — Ambulatory Visit: Payer: BLUE CROSS/BLUE SHIELD | Admitting: Family Medicine

## 2018-04-23 ENCOUNTER — Encounter: Payer: Self-pay | Admitting: Family Medicine

## 2018-04-23 VITALS — BP 119/79 | HR 88 | Temp 98.2°F | Ht 64.0 in | Wt 174.2 lb

## 2018-04-23 DIAGNOSIS — E559 Vitamin D deficiency, unspecified: Secondary | ICD-10-CM | POA: Diagnosis not present

## 2018-04-23 DIAGNOSIS — I10 Essential (primary) hypertension: Secondary | ICD-10-CM

## 2018-04-23 DIAGNOSIS — Z6829 Body mass index (BMI) 29.0-29.9, adult: Secondary | ICD-10-CM

## 2018-04-23 NOTE — Patient Instructions (Signed)
° ° ° °  If you have lab work done today you will be contacted with your lab results within the next 2 weeks.  If you have not heard from us then please contact us. The fastest way to get your results is to register for My Chart. ° ° °IF you received an x-ray today, you will receive an invoice from [facility]. Please contact [facility] at [phone] with questions or concerns regarding your invoice.  ° °IF you received labwork today, you will receive an invoice from [facility]. Please contact [facility] at [phone] with questions or concerns regarding your invoice.  ° °Our billing staff will not be able to assist you with questions regarding bills from these companies. ° °You will be contacted with the lab results as soon as they are available. The fastest way to get your results is to activate your My Chart account. Instructions are located on the last page of this paperwork. If you have not heard from us regarding the results in 2 weeks, please contact this office. °  ° ° ° °

## 2018-04-23 NOTE — Progress Notes (Signed)
10/22/20199:37 AM  Margaret Rice 1981-05-01, 37 y.o. female [id number]  Chief Complaint  Patient presents with  . Hypertension    follow up for bp, will also like to have labs drawn for vit d levels    HPI:   Patient is a 37 y.o. female with past medical history significant for HTN, PCOS, vitamin D deficiency who presents today for routine followup  Previous PCP Dr Katrinka Blazing Last visit July 2019  Since last visit decreased metoprolol to 25mg  daily This has been working well  She tried decreasing further, but BP elevated Continues to have very slight palpitations mostly at night, no associated symptoms with these Has not needed to take prn propranolol  Vitamin d, cont on oral supplements Has added a a hair, nail, skin suppplement Total 2800 IU a day  Has lost weight Calorie counting, ~ 1500 a day   Fall Risk  04/23/2018 01/09/2018 11/12/2017 09/07/2017 05/14/2017  Falls in the past year? No No No No No     Depression screen [facility] 2/9 04/23/2018 01/09/2018 11/12/2017  Decreased Interest 0 0 0  Down, Depressed, Hopeless 0 0 0  PHQ - 2 Score 0 0 0    No Known Allergies  Prior to Admission medications   Medication Sig Start Date End Date Taking? Authorizing Provider  ACETAMINOPHEN PO Take by mouth as needed.   Yes [provider]  [place] 0.1-20 MG-MCG(21) TABS  08/31/17  Yes [provider]  Cholecalciferol (VITAMIN D) 2000 units CAPS Take 2,800 Units by mouth daily.    Yes [provider]  Ferrous Sulfate (IRON SUPPLEMENT PO) Take by mouth daily.   Yes [provider]  metoprolol succinate (TOPROL-XL) 25 MG 24 hr tablet TAKE 1 TABLET BY MOUTH EVERYDAY AT BEDTIME 04/10/18  Yes Myles Lipps, MD  Multiple Vitamins-Minerals (PRESERVISION AREDS 2) CAPS Take by mouth.   Yes [provider]    Past Medical History:  Diagnosis Date  . Hypertension   . Kidney stones   . PCOS (polycystic ovarian syndrome)   . Vitamin D deficiency      Past Surgical History:  Procedure Laterality Date  . MOLE REMOVAL     BACK    Social History   Tobacco Use  . Smoking status: Never Smoker  . Smokeless tobacco: Never Used  Substance Use Topics  . Alcohol use: No    Family History  Problem Relation Age of Onset  . Hypertension Mother   . Hypertension Father   . Diverticulitis Father         PARTIAL COLON REMOVED  . Heart disease Maternal Grandmother   . Heart disease Paternal Grandmother     Review of Systems  Constitutional: Negative for chills and fever.  Respiratory: Negative for cough and shortness of breath.   Cardiovascular: Positive for palpitations. Negative for chest pain and leg swelling.  Gastrointestinal: Negative for abdominal pain, nausea and vomiting.  Neurological: Negative for dizziness.     OBJECTIVE:  Blood pressure 119/79, pulse 88, temperature 98.2 F (36.8 C), temperature source Oral, height 5\' 4"  (1.626 m), weight 174 lb 3.2 oz (79 kg), last menstrual period 04/16/2018, SpO2 99 %. Body mass index is 29.9 kg/m.   Wt Readings from Last 3 Encounters:  04/23/18 174 lb 3.2 oz (79 kg)  01/09/18 181 lb (82.1 kg)  12/17/17 179 lb 12 oz (81.5 kg)   BP Readings from Last 3 Encounters:  04/23/18 119/79  01/09/18 98/62  12/17/17 113/75  Physical Exam  Constitutional: She is oriented to person, place, and time. She appears well-developed and well-nourished.  HENT:  Head: Normocephalic and atraumatic.  Mouth/Throat: Oropharynx is clear and moist. No oropharyngeal exudate.  Eyes: Pupils are equal, round, and reactive to light. Conjunctivae and EOM are normal. No scleral icterus.  Neck: Neck supple.  Cardiovascular: Normal rate, regular rhythm and normal heart sounds. Exam reveals no gallop and no friction rub.  No murmur heard. Pulmonary/Chest: Effort normal and breath sounds normal. She has no wheezes. She has no rales.  Musculoskeletal: She exhibits no edema.  Neurological: She is alert  and oriented to person, place, and time.  Skin: Skin is warm and dry.  Psychiatric: She has a normal mood and affect.  Nursing note and vitals reviewed.   ASSESSMENT and PLAN  1. Vitamin D deficiency Checking labs today, medications will be adjusted as needed.  - VITAMIN D 25 Hydroxy (Vit-D Deficiency, Fractures)  2. Essential hypertension Controlled. Continue current regime.  - Basic metabolic panel  3. BMI 29.0-29.9,adult Congratulated patient. Continue with weight loss efforts, healthy diet and regular exercise.  Return in about 3 months (around 07/24/2018) for CPE.    Myles Lipps, MD Primary Care at [facility] [address] [place], [place] [address] Ph.  [phone] Fax [phone]

## 2018-04-24 LAB — BASIC METABOLIC PANEL
BUN/Creatinine Ratio: 16 (ref 9–23)
BUN: 13 mg/dL (ref 6–20)
CO2: 15 mmol/L — CL (ref 20–29)
Calcium: 9.3 mg/dL (ref 8.7–10.2)
Chloride: 108 mmol/L — ABNORMAL HIGH (ref 96–106)
Creatinine, Ser: 0.79 mg/dL (ref 0.57–1.00)
GFR calc Af Amer: 109 mL/min/{1.73_m2} (ref >59)
GFR calc non Af Amer: 95 mL/min/{1.73_m2} (ref >59)
Glucose: 78 mg/dL (ref 65–99)
Potassium: 4.9 mmol/L (ref 3.5–5.2)
Sodium: 142 mmol/L (ref 134–144)

## 2018-04-24 LAB — VITAMIN D 25 HYDROXY (VIT D DEFICIENCY, FRACTURES): Vit D, 25-Hydroxy: 34.6 ng/mL (ref 30.0–100.0)

## 2018-04-24 NOTE — Addendum Note (Signed)
Addended by: Myles Lipps on: 04/24/2018 09:55 AM   Modules accepted: Orders

## 2018-04-25 ENCOUNTER — Other Ambulatory Visit: Payer: Self-pay | Admitting: *Deleted

## 2018-07-11 ENCOUNTER — Other Ambulatory Visit: Payer: Self-pay | Admitting: Family Medicine

## 2018-07-11 NOTE — Telephone Encounter (Signed)
.  rxp

## 2018-07-19 ENCOUNTER — Other Ambulatory Visit: Payer: Self-pay

## 2018-07-19 ENCOUNTER — Ambulatory Visit (INDEPENDENT_AMBULATORY_CARE_PROVIDER_SITE_OTHER): Payer: BLUE CROSS/BLUE SHIELD | Admitting: Family Medicine

## 2018-07-19 ENCOUNTER — Encounter: Payer: Self-pay | Admitting: Family Medicine

## 2018-07-19 VITALS — BP 121/84 | HR 87 | Temp 98.5°F | Ht 64.0 in | Wt 183.5 lb

## 2018-07-19 DIAGNOSIS — Z23 Encounter for immunization: Secondary | ICD-10-CM

## 2018-07-19 DIAGNOSIS — Z0001 Encounter for general adult medical examination with abnormal findings: Secondary | ICD-10-CM

## 2018-07-19 DIAGNOSIS — I10 Essential (primary) hypertension: Secondary | ICD-10-CM | POA: Diagnosis not present

## 2018-07-19 DIAGNOSIS — Z6831 Body mass index (BMI) 31.0-31.9, adult: Secondary | ICD-10-CM

## 2018-07-19 DIAGNOSIS — Z0000 Encounter for general adult medical examination without abnormal findings: Secondary | ICD-10-CM

## 2018-07-19 MED ORDER — METOPROLOL SUCCINATE ER 25 MG PO TB24: ORAL_TABLET | ORAL | 3 refills | Status: DC

## 2018-07-19 NOTE — Progress Notes (Signed)
1/17/20208:19 AM  Margaret Rice 1980-11-20, 38 y.o. female [id number]  Chief Complaint  Patient presents with  . Annual Exam    PAPis scheduled for 10/2018    HPI:   Patient is a 38 y.o. female with past medical history per below who presents today for CPE  G&Ps: 0 Pap: Dr Tressia Danas at [facility], scheduled 10/2018 STD: not sexually active BC : [facility] Menses: regular on [facility] Mammogram: at age 59 [place] breast/ovarian cancer: none FHx colon cancer: none Exercise/diet: walks as exercise, has slowly been working on weight loss, needs to cut back more on fried foods Most Recent Immunizations  Administered Date(s) Administered  . Influenza-Unspecified 04/02/2016  . Tdap 07/25/2016   going to a therapist for anxiety Sees eye doctor yearly  Sees dentist twice a year  Fall Risk  07/19/2018 04/23/2018 01/09/2018 11/12/2017 09/07/2017  Falls in the past year? 0 No No No No     Depression screen [facility] 2/9 07/19/2018 04/23/2018 01/09/2018  Decreased Interest 0 0 0  Down, Depressed, Hopeless 0 0 0  PHQ - 2 Score 0 0 0    No Known Allergies  Prior to Admission medications   Medication Sig Start Date End Date Taking? Authorizing Provider  ACETAMINOPHEN PO Take by mouth as needed.   Yes [provider]  Orlena Sheldon 0.1-20 MG-MCG(21) TABS  08/31/17  Yes [provider]  Cholecalciferol (VITAMIN D) 2000 units CAPS Take 2,800 Units by mouth daily.    Yes [provider]  Ferrous Sulfate (IRON SUPPLEMENT PO) Take by mouth daily.   Yes [provider]  metoprolol succinate (TOPROL-XL) 25 MG 24 hr tablet TAKE 1 TABLET BY MOUTH EVERYDAY AT BEDTIME 07/11/18  Yes Rutherford Guys, MD  Multiple Vitamins-Minerals (PRESERVISION AREDS 2) CAPS Take by mouth.   Yes [provider]    Past Medical History:  Diagnosis Date  . Hypertension   . Kidney stones   . PCOS (polycystic ovarian syndrome)   . Vitamin D deficiency     Past Surgical History:    Procedure Laterality Date  . MOLE REMOVAL     BACK    Social History   Tobacco Use  . Smoking status: Never Smoker  . Smokeless tobacco: Never Used  Substance Use Topics  . Alcohol use: No    Family History  Problem Relation Age of Onset  . Hypertension Mother   . Hypertension Father   . Diverticulitis Father         PARTIAL COLON REMOVED  . Heart disease Maternal Grandmother   . Heart disease Paternal Grandmother     Review of Systems  Constitutional: Negative for chills and fever.  Respiratory: Negative for cough and shortness of breath.   Cardiovascular: Negative for chest pain, palpitations and leg swelling.  Gastrointestinal: Negative for abdominal pain, nausea and vomiting.     OBJECTIVE:  Blood pressure 121/84, pulse 87, temperature 98.5 F (36.9 C), temperature source Oral, height '5\' 4"'  (1.626 m), weight 183 lb 8 oz (83.2 kg), SpO2 98 %. Body mass index is 31.5 kg/m.   Wt Readings from Last 3 Encounters:  04/23/18 174 lb 3.2 oz (79 kg)  01/09/18 181 lb (82.1 kg)  12/17/17 179 lb 12 oz (81.5 kg)    Visual Acuity Screening   Right eye Left eye Both eyes  Without correction: '20/25 20/30 20/25 '  With correction:       Physical Exam Vitals signs and nursing note reviewed.  Constitutional:  Appearance: She is well-developed.  HENT:     Head: Normocephalic and atraumatic.     Right Ear: Hearing, tympanic membrane, ear canal and external ear normal.     Left Ear: Hearing, tympanic membrane, ear canal and external ear normal.  Eyes:     Conjunctiva/sclera: Conjunctivae normal.     Pupils: Pupils are equal, round, and reactive to light.  Neck:     Musculoskeletal: Neck supple.     Thyroid: No thyromegaly.  Cardiovascular:     Rate and Rhythm: Normal rate and regular rhythm.     Heart sounds: Normal heart sounds. No murmur. No friction rub. No gallop.   Pulmonary:     Effort: Pulmonary effort is normal.     Breath sounds: Normal breath sounds. No  wheezing or rales.  Abdominal:     General: Bowel sounds are normal. There is no distension.     Palpations: Abdomen is soft. There is no mass.     Tenderness: There is no abdominal tenderness.  Musculoskeletal: Normal range of motion.  Lymphadenopathy:     Cervical: No cervical adenopathy.  Skin:    General: Skin is warm and dry.  Neurological:     Mental Status: She is alert and oriented to person, place, and time.     Cranial Nerves: No cranial nerve deficit.     Gait: Gait normal.     Deep Tendon Reflexes: Reflexes are normal and symmetric.    ASSESSMENT and PLAN  1. Annual physical exam Routine HCM labs ordered. HCM reviewed/discussed. Anticipatory guidance regarding healthy weight, lifestyle and choices given.   2. BMI 31.0-31.9,adult Discussed importance of healthy diet, regular exercise and healthy weight.   3. Essential hypertension Controlled. Continue current regime.  - Lipid panel - TSH - CMP14+EGFR  Other orders - Flu Vaccine QUAD 36+ mos IM - metoprolol succinate (TOPROL-XL) 25 MG 24 hr tablet; TAKE 1 TABLET BY MOUTH EVERYDAY AT BEDTIME    Return in about 1 year (around 07/20/2019).    Rutherford Guys, MD Primary Care at [facility], [place] [address] Ph.  [phone] Fax [phone]

## 2018-07-19 NOTE — Patient Instructions (Addendum)
Preventive Care 18-39 Years, Female Preventive care refers to lifestyle choices and visits with your health care provider that can promote health and wellness. What does preventive care include?   A yearly physical exam. This is also called an annual well check.  Dental exams once or twice a year.  Routine eye exams. Ask your health care provider how often you should have your eyes checked.  Personal lifestyle choices, including: ? Daily care of your teeth and gums. ? Regular physical activity. ? Eating a healthy diet. ? Avoiding tobacco and drug use. ? Limiting alcohol use. ? Practicing safe sex. ? Taking vitamin and mineral supplements as recommended by your health care provider. What happens during an annual well check? The services and screenings done by your health care provider during your annual well check will depend on your age, overall health, lifestyle risk factors, and family history of disease. Counseling Your health care provider may ask you questions about your:  Alcohol use.  Tobacco use.  Drug use.  Emotional well-being.  Home and relationship well-being.  Sexual activity.  Eating habits.  Work and work Statistician.  Method of birth control.  Menstrual cycle.  Pregnancy history. Screening You may have the following tests or measurements:  Height, weight, and BMI.  Diabetes screening. This is done by checking your blood sugar (glucose) after you have not eaten for a while (fasting).  Blood pressure.  Lipid and cholesterol levels. These may be checked every 5 years starting at age 47.  Skin check.  Hepatitis C blood test.  Hepatitis B blood test.  Sexually transmitted disease (STD) testing.  BRCA-related cancer screening. This may be done if you have a family history of breast, ovarian, tubal, or peritoneal cancers.  Pelvic exam and Pap test. This may be done every 3 years starting at age 64. Starting at age 53, this may be done every 5  years if you have a Pap test in combination with an HPV test. Discuss your test results, treatment options, and if necessary, the need for more tests with your health care provider. Vaccines Your health care provider may recommend certain vaccines, such as:  Influenza vaccine. This is recommended every year.  Tetanus, diphtheria, and acellular pertussis (Tdap, Td) vaccine. You may need a Td booster every 10 years.  Varicella vaccine. You may need this if you have not been vaccinated.  HPV vaccine. If you are 7 or younger, you may need three doses over 6 months.  Measles, mumps, and rubella (MMR) vaccine. You may need at least one dose of MMR. You may also need a second dose.  Pneumococcal 13-valent conjugate (PCV13) vaccine. You may need this if you have certain conditions and were not previously vaccinated.  Pneumococcal polysaccharide (PPSV23) vaccine. You may need one or two doses if you smoke cigarettes or if you have certain conditions.  Meningococcal vaccine. One dose is recommended if you are age 71-21 years and a first-year college student living in a residence hall, or if you have one of several medical conditions. You may also need additional booster doses.  Hepatitis A vaccine. You may need this if you have certain conditions or if you travel or work in places where you may be exposed to hepatitis A.  Hepatitis B vaccine. You may need this if you have certain conditions or if you travel or work in places where you may be exposed to hepatitis B.  Haemophilus influenzae type b (Hib) vaccine. You may need this if you  have certain risk factors. Talk to your health care provider about which screenings and vaccines you need and how often you need them. This information is not intended to replace advice given to you by your health care provider. Make sure you discuss any questions you have with your health care provider. Document Released: 08/15/2001 Document Revised: 01/30/2017  Document Reviewed: 04/20/2015 Elsevier Interactive Patient Education  [employer].     If you have lab work done today you will be contacted with your lab results within the next 2 weeks.  If you have not heard from [place] then please contact us. The fastest way to get your results is to register for My Chart.   IF you received an x-ray today, you will receive an invoice from [facility]. Please contact [facility] at [phone] with questions or concerns regarding your invoice.   IF you received labwork today, you will receive an invoice from [facility]. Please contact [facility] at [phone] with questions or concerns regarding your invoice.   Our billing staff will not be able to assist you with questions regarding bills from these companies.  You will be contacted with the lab results as soon as they are available. The fastest way to get your results is to activate your My Chart account. Instructions are located on the last page of this paperwork. If you have not heard from [place] regarding the results in 2 weeks, please contact this office.

## 2018-07-20 LAB — CMP14+EGFR
ALT: 17 IU/L (ref 0–32)
AST: 13 IU/L (ref 0–40)
Albumin/Globulin Ratio: 1.6 (ref 1.2–2.2)
Albumin: 4.1 g/dL (ref 3.5–5.5)
Alkaline Phosphatase: 78 IU/L (ref 39–117)
BUN/Creatinine Ratio: 16 (ref 9–23)
BUN: 12 mg/dL (ref 6–20)
Bilirubin Total: 0.9 mg/dL (ref 0.0–1.2)
CO2: 20 mmol/L (ref 20–29)
Calcium: 9.4 mg/dL (ref 8.7–10.2)
Chloride: 102 mmol/L (ref 96–106)
Creatinine, Ser: 0.73 mg/dL (ref 0.57–1.00)
GFR calc Af Amer: 120 mL/min/{1.73_m2} (ref >59)
GFR calc non Af Amer: 104 mL/min/{1.73_m2} (ref >59)
Globulin, Total: 2.6 g/dL (ref 1.5–4.5)
Glucose: 85 mg/dL (ref 65–99)
Potassium: 4.3 mmol/L (ref 3.5–5.2)
Sodium: 137 mmol/L (ref 134–144)
Total Protein: 6.7 g/dL (ref 6.0–8.5)

## 2018-07-20 LAB — LIPID PANEL
Chol/HDL Ratio: 2.5 ratio (ref 0.0–4.4)
Cholesterol, Total: 161 mg/dL (ref 100–199)
HDL: 65 mg/dL (ref >39)
LDL Calculated: 85 mg/dL (ref 0–99)
Triglycerides: 56 mg/dL (ref 0–149)
VLDL Cholesterol Cal: 11 mg/dL (ref 5–40)

## 2018-07-20 LAB — TSH: TSH: 1.25 u[IU]/mL (ref 0.450–4.500)

## 2018-11-21 ENCOUNTER — Ambulatory Visit: Payer: Self-pay | Admitting: *Deleted

## 2018-11-21 NOTE — Telephone Encounter (Signed)
Patient had several covid-19 symptoms last week. Including cough/labored breathing/mild body aches and fatigue. She had not had any symptoms for 2 days. She is inquiring about covid antibody testing. No advice to give-routing to PCP for advice. Patient may be reached at number in Epic.   Reason for Disposition . [1] Caller requesting NON-URGENT health information AND [2] PCP's office is the best resource  Answer Assessment - Initial Assessment Questions 1. REASON FOR CALL or QUESTION: "What is your reason for calling today?" or "How can I best help you?" or "What question do you have that I can help answer?"     Interested in testing for covid-19 antibodies.  Protocols used: INFORMATION ONLY CALL-A-AH

## 2018-11-22 NOTE — Telephone Encounter (Signed)
Pt needs appt with tele or virtual with [place]

## 2018-11-26 ENCOUNTER — Telehealth: Payer: Self-pay | Admitting: Family Medicine

## 2018-11-26 ENCOUNTER — Ambulatory Visit: Payer: Self-pay

## 2018-11-26 NOTE — Telephone Encounter (Signed)
See triage note.

## 2018-11-26 NOTE — Telephone Encounter (Signed)
Pt called to say she spoke with someone last week who routed a note to dr Leretha Pol. Pt is requesting antibody testing. Pt was advised that we do not do antibody.  Call placed to office per pt request. I spoke to [facility] clinical manager and confirmed no testing for antibodies. Pt was informed. Pt states no triage needed for symptoms. Her symptoms are resolving.  She denies Chest pain SOB difficulty breathing. Answer Assessment - Initial Assessment Questions 1. SYMPTOMS: "Do you have any symptoms?"     Symptoms are gone last of cough resolved yesterday 2. SEVERITY: If symptoms are present, ask "Are they mild, moderate or severe?"    Pt wants and order for antibody testing  Protocols used: MEDICATION QUESTION CALL-A-AH

## 2018-11-28 ENCOUNTER — Telehealth: Payer: Self-pay | Admitting: Family Medicine

## 2018-11-28 NOTE — Telephone Encounter (Signed)
Called pt LVM for pat o call us to schedula virtual or telemed visit FR

## 2018-11-28 NOTE — Telephone Encounter (Signed)
Pt was called LVM for pt to call and make a virtual ot telemed vist FR

## 2018-12-18 ENCOUNTER — Other Ambulatory Visit: Payer: Self-pay | Admitting: Obstetrics and Gynecology

## 2018-12-18 DIAGNOSIS — R928 Other abnormal and inconclusive findings on diagnostic imaging of breast: Secondary | ICD-10-CM

## 2018-12-25 ENCOUNTER — Ambulatory Visit
Admission: RE | Admit: 2018-12-25 | Discharge: 2018-12-25 | Disposition: A | Discharge status: Home / Self Care | Payer: BLUE CROSS/BLUE SHIELD | Source: Ambulatory Visit | Attending: Obstetrics and Gynecology | Admitting: Obstetrics and Gynecology

## 2018-12-25 ENCOUNTER — Other Ambulatory Visit: Payer: Self-pay

## 2018-12-25 DIAGNOSIS — R928 Other abnormal and inconclusive findings on diagnostic imaging of breast: Secondary | ICD-10-CM

## 2019-01-31 ENCOUNTER — Encounter: Payer: Self-pay | Admitting: Family Medicine

## 2019-05-03 IMAGING — DX DG CHEST 2V
2 series · 2 of 2 positions shown · non-contrast
Comparison: Radiographs October 22, 2008.

CLINICAL DATA: Abnormal EKG.

EXAM:
CHEST  2 VIEW

[chest pa]
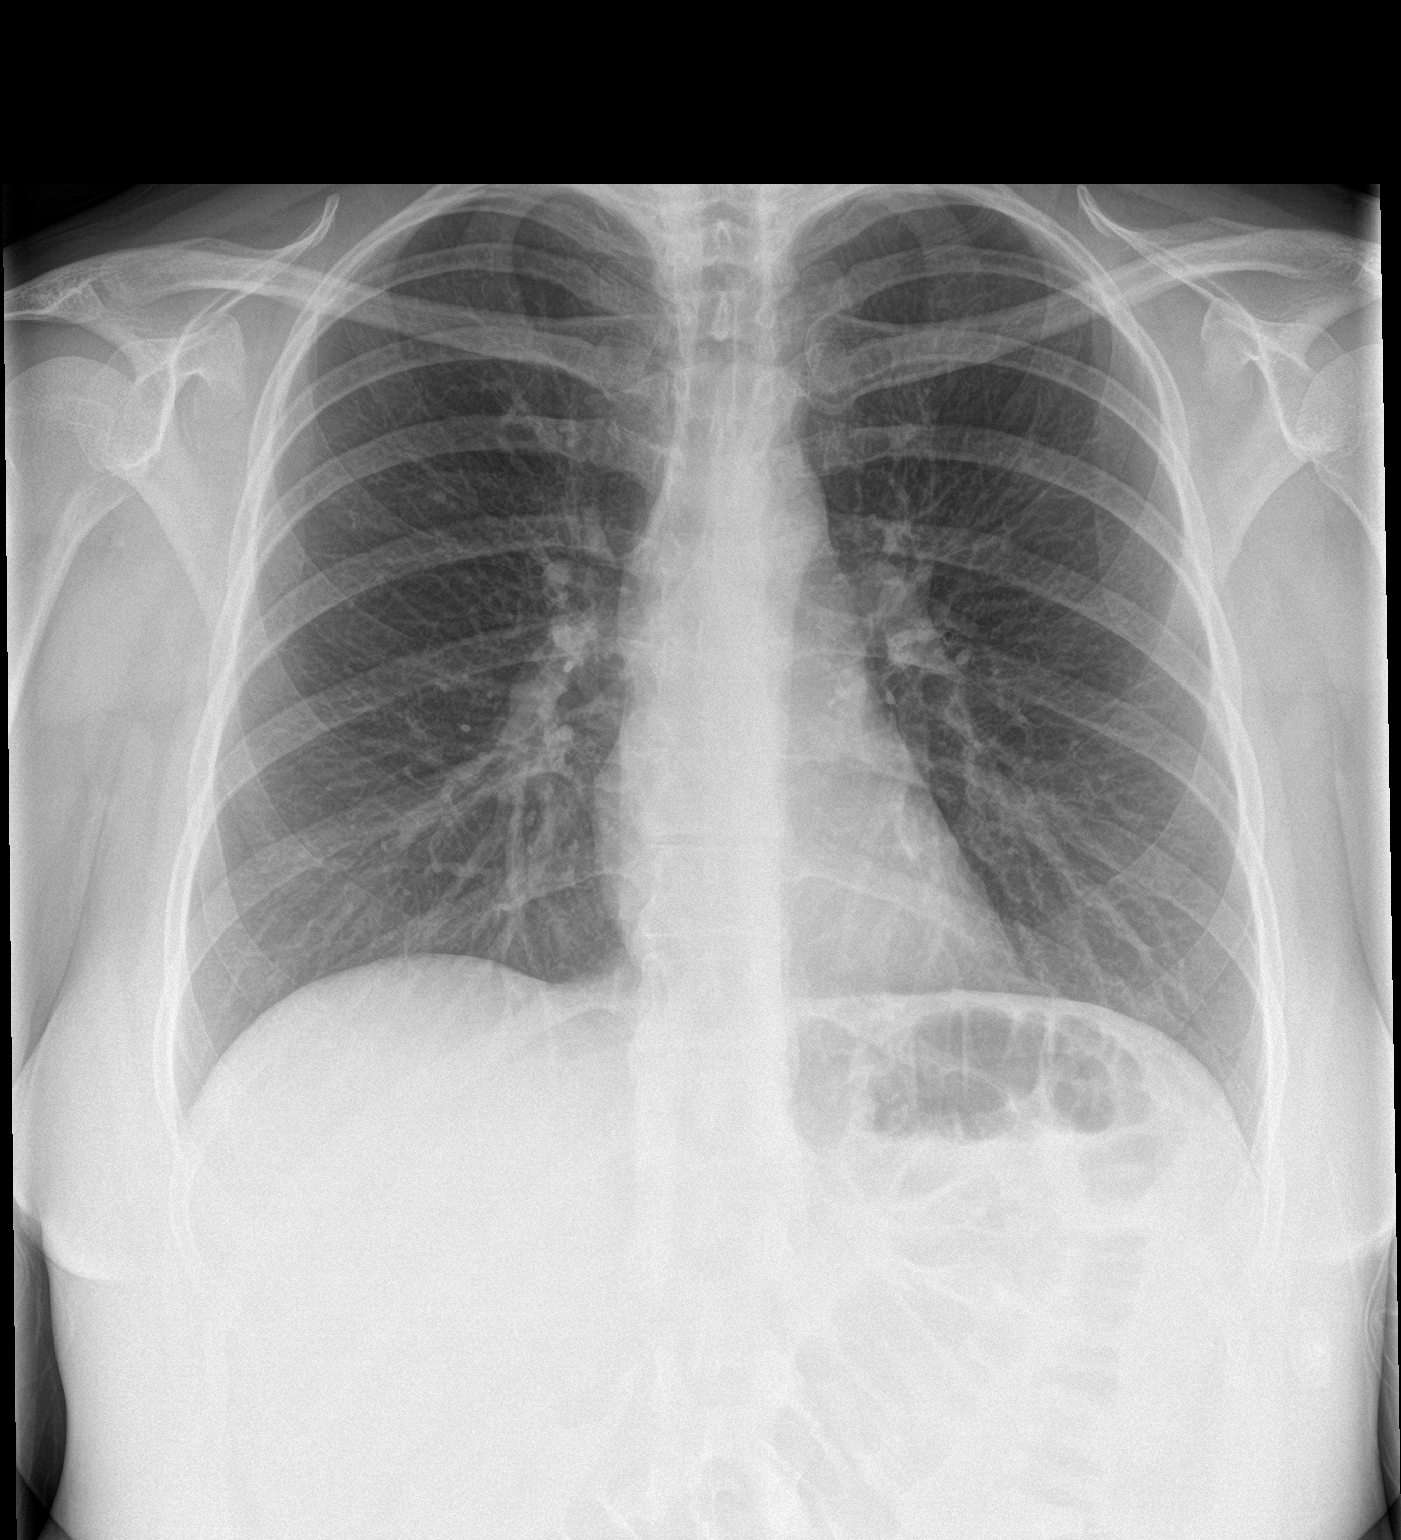

[chest lat]
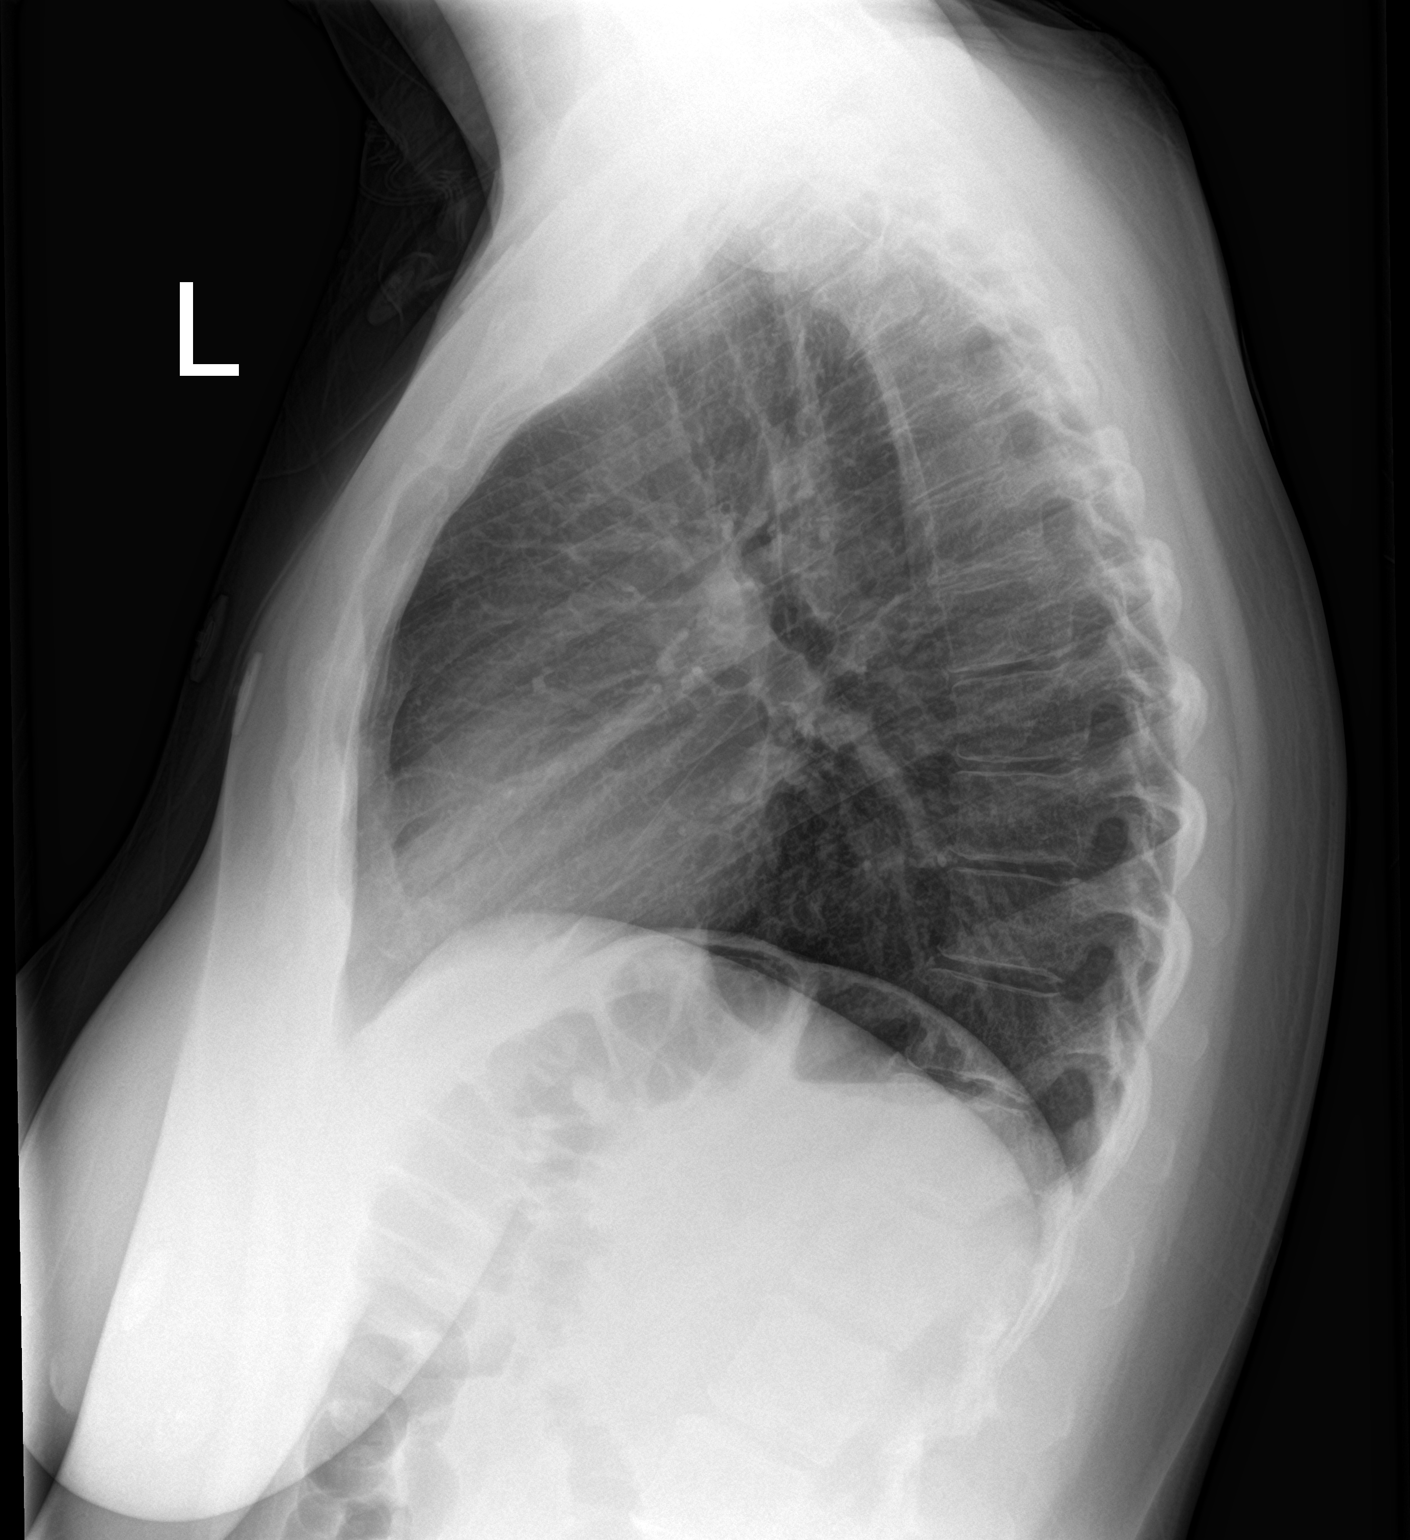

[2 of 2 positions shown; findings below may reference images not displayed]

FINDINGS: The heart size and mediastinal contours are within normal limits.
Both lungs are clear. No pneumothorax or pleural effusion is noted.
The visualized skeletal structures are unremarkable.
IMPRESSION: No active cardiopulmonary disease.

## 2019-09-02 ENCOUNTER — Other Ambulatory Visit: Payer: Self-pay | Admitting: Family Medicine

## 2019-09-02 DIAGNOSIS — I10 Essential (primary) hypertension: Secondary | ICD-10-CM

## 2019-09-02 NOTE — Telephone Encounter (Signed)
Requested medication (s) are due for refill today: yes  Requested medication (s) are on the active medication list: yes  Last refill: 06/06/2019  Future visit scheduled: no  Notes to clinic:  no valid encounter within last 6 month   Requested Prescriptions  Pending Prescriptions Disp Refills   metoprolol succinate (TOPROL-XL) 25 MG 24 hr tablet [Pharmacy Med Name: METOPROLOL SUCC ER 25 MG TAB] 90 tablet 3    Sig: TAKE 1 TABLET BY MOUTH EVERYDAY AT BEDTIME      Cardiovascular:  Beta Blockers Failed - 09/02/2019  1:19 AM      Failed - Valid encounter within last 6 months    Recent Outpatient Visits           1 year ago Annual physical exam   [facility] at [facility], Meda Coffee, MD   1 year ago Vitamin D deficiency   [facility], Meda Coffee, MD   1 year ago Essential hypertension   Primary Care at [facility], Myrle Sheng, MD   1 year ago Essential hypertension   Primary Care at [facility], Myrle Sheng, MD   1 year ago Viral URI with cough   Primary Care at [facility], [place], [place]              Passed - Last BP in normal range    BP Readings from Last 1 Encounters:  07/19/18 121/84          Passed - Last Heart Rate in normal range    Pulse Readings from Last 1 Encounters:  07/19/18 87

## 2019-09-29 ENCOUNTER — Other Ambulatory Visit: Payer: Self-pay | Admitting: Family Medicine

## 2019-09-29 DIAGNOSIS — I10 Essential (primary) hypertension: Secondary | ICD-10-CM

## 2019-09-29 NOTE — Telephone Encounter (Signed)
Pt needs appt for lab and ov

## 2019-09-29 NOTE — Telephone Encounter (Signed)
Requested medication (s) are due for refill today: yes  Requested medication (s) are on the active medication list: yes  Last refill: 09/02/19  Future visit scheduled: no  Notes to clinic:  no valid encounter within last 6 months    Requested Prescriptions  Pending Prescriptions Disp Refills   metoprolol succinate (TOPROL-XL) 25 MG 24 hr tablet [Pharmacy Med Name: METOPROLOL SUCC ER 25 MG TAB] 30 tablet 0    Sig: TAKE 1 TABLET BY MOUTH EVERYDAY AT BEDTIME      Cardiovascular:  Beta Blockers Failed - 09/29/2019  9:31 AM      Failed - Valid encounter within last 6 months    Recent Outpatient Visits           1 year ago Annual physical exam   [facility] at [facility], Meda Coffee, MD   1 year ago Vitamin D deficiency   [facility], Meda Coffee, MD   1 year ago Essential hypertension   Primary Care at [facility], Myrle Sheng, MD   1 year ago Essential hypertension   Primary Care at [facility], Myrle Sheng, MD   1 year ago Viral URI with cough   Primary Care at [facility], [place], [place]              Passed - Last BP in normal range    BP Readings from Last 1 Encounters:  07/19/18 121/84          Passed - Last Heart Rate in normal range    Pulse Readings from Last 1 Encounters:  07/19/18 87

## 2019-10-07 NOTE — Telephone Encounter (Signed)
Lvm for patient to call back and reschedule

## 2019-10-07 NOTE — Telephone Encounter (Signed)
Called pt. And scheduled

## 2019-11-06 ENCOUNTER — Encounter: Payer: Self-pay | Admitting: Family Medicine

## 2019-11-06 ENCOUNTER — Ambulatory Visit (INDEPENDENT_AMBULATORY_CARE_PROVIDER_SITE_OTHER): Payer: BC Managed Care – PPO | Admitting: Family Medicine

## 2019-11-06 ENCOUNTER — Other Ambulatory Visit: Payer: Self-pay

## 2019-11-06 VITALS — BP 115/82 | HR 80 | Temp 98.4°F | Resp 15 | Ht 64.75 in | Wt 193.2 lb

## 2019-11-06 DIAGNOSIS — Z113 Encounter for screening for infections with a predominantly sexual mode of transmission: Secondary | ICD-10-CM

## 2019-11-06 DIAGNOSIS — Z130 Encounter for screening for diseases of the blood and blood-forming organs and certain disorders involving the immune mechanism: Secondary | ICD-10-CM | POA: Diagnosis not present

## 2019-11-06 DIAGNOSIS — H9193 Unspecified hearing loss, bilateral: Secondary | ICD-10-CM | POA: Diagnosis not present

## 2019-11-06 DIAGNOSIS — Z0000 Encounter for general adult medical examination without abnormal findings: Secondary | ICD-10-CM

## 2019-11-06 DIAGNOSIS — Z13220 Encounter for screening for lipoid disorders: Secondary | ICD-10-CM

## 2019-11-06 DIAGNOSIS — Z0001 Encounter for general adult medical examination with abnormal findings: Secondary | ICD-10-CM | POA: Diagnosis not present

## 2019-11-06 DIAGNOSIS — Z1329 Encounter for screening for other suspected endocrine disorder: Secondary | ICD-10-CM

## 2019-11-06 DIAGNOSIS — Z13228 Encounter for screening for other metabolic disorders: Secondary | ICD-10-CM

## 2019-11-06 NOTE — Patient Instructions (Addendum)
   If you have lab work done today you will be contacted with your lab results within the next 2 weeks.  If you have not heard from us then please contact us. The fastest way to get your results is to register for My Chart.   IF you received an x-ray today, you will receive an invoice from [facility]. Please contact [facility] at [phone] with questions or concerns regarding your invoice.   IF you received labwork today, you will receive an invoice from [facility]. Please contact [facility] at [phone] with questions or concerns regarding your invoice.   Our billing staff will not be able to assist you with questions regarding bills from these companies.  You will be contacted with the lab results as soon as they are available. The fastest way to get your results is to activate your My Chart account. Instructions are located on the last page of this paperwork. If you have not heard from us regarding the results in 2 weeks, please contact this office.       Health Maintenance, Female Adopting a healthy lifestyle and getting preventive care are important in promoting health and wellness. Ask your health care provider about:  The right schedule for you to have regular tests and exams.  Things you can do on your own to prevent diseases and keep yourself healthy. What should I know about diet, weight, and exercise? Eat a healthy diet   Eat a diet that includes plenty of vegetables, fruits, low-fat dairy products, and lean protein.  Do not eat a lot of foods that are high in solid fats, added sugars, or sodium. Maintain a healthy weight Body mass index (BMI) is used to identify weight problems. It estimates body fat based on height and weight. Your health care provider can help determine your BMI and help you achieve or maintain a healthy weight. Get regular exercise Get regular exercise. This is one of the most important things you can do for your health. Most  adults should:  Exercise for at least 150 minutes each week. The exercise should increase your heart rate and make you sweat (moderate-intensity exercise).  Do strengthening exercises at least twice a week. This is in addition to the moderate-intensity exercise.  Spend less time sitting. Even light physical activity can be beneficial. Watch cholesterol and blood lipids Have your blood tested for lipids and cholesterol at 41 years of age, then have this test every 5 years. Have your cholesterol levels checked more often if:  Your lipid or cholesterol levels are high.  You are older than 40 years of age.  You are at high risk for heart disease. What should I know about cancer screening? Depending on your health history and family history, you may need to have cancer screening at various ages. This may include screening for:  Breast cancer.  Cervical cancer.  Colorectal cancer.  Skin cancer.  Lung cancer. What should I know about heart disease, diabetes, and high blood pressure? Blood pressure and heart disease  High blood pressure causes heart disease and increases the risk of stroke. This is more likely to develop in people who have high blood pressure readings, are of African descent, or are overweight.  Have your blood pressure checked: ? Every 3-5 years if you are 18-39 years of age. ? Every year if you are 40 years old or older. Diabetes Have regular diabetes screenings. This checks your fasting blood sugar level. Have the screening done:  Once   every three years after age 40 if you are at a normal weight and have a low risk for diabetes.  More often and at a younger age if you are overweight or have a high risk for diabetes. What should I know about preventing infection? Hepatitis B If you have a higher risk for hepatitis B, you should be screened for this virus. Talk with your health care provider to find out if you are at risk for hepatitis B infection. Hepatitis  C Testing is recommended for:  Everyone born from 1945 through 1965.  Anyone with known risk factors for hepatitis C. Sexually transmitted infections (STIs)  Get screened for STIs, including gonorrhea and chlamydia, if: ? You are sexually active and are younger than 41 years of age. ? You are older than 41 years of age and your health care provider tells you that you are at risk for this type of infection. ? Your sexual activity has changed since you were last screened, and you are at increased risk for chlamydia or gonorrhea. Ask your health care provider if you are at risk.  Ask your health care provider about whether you are at high risk for HIV. Your health care provider may recommend a prescription medicine to help prevent HIV infection. If you choose to take medicine to prevent HIV, you should first get tested for HIV. You should then be tested every 3 months for as long as you are taking the medicine. Pregnancy  If you are about to stop having your period (premenopausal) and you may become pregnant, seek counseling before you get pregnant.  Take 400 to 800 micrograms (mcg) of folic acid every day if you become pregnant.  Ask for birth control (contraception) if you want to prevent pregnancy. Osteoporosis and menopause Osteoporosis is a disease in which the bones lose minerals and strength with aging. This can result in bone fractures. If you are 65 years old or older, or if you are at risk for osteoporosis and fractures, ask your health care provider if you should:  Be screened for bone loss.  Take a calcium or vitamin D supplement to lower your risk of fractures.  Be given hormone replacement therapy (HRT) to treat symptoms of menopause. Follow these instructions at home: Lifestyle  Do not use any products that contain nicotine or tobacco, such as cigarettes, e-cigarettes, and chewing tobacco. If you need help quitting, ask your health care provider.  Do not use street  drugs.  Do not share needles.  Ask your health care provider for help if you need support or information about quitting drugs. Alcohol use  Do not drink alcohol if: ? Your health care provider tells you not to drink. ? You are pregnant, may be pregnant, or are planning to become pregnant.  If you drink alcohol: ? Limit how much you use to 0-1 drink a day. ? Limit intake if you are breastfeeding.  Be aware of how much alcohol is in your drink. In the [place] one drink equals one 12 oz bottle of beer (355 mL), one 5 oz glass of wine (148 mL), or one 1 oz glass of hard liquor (44 mL). General instructions  Schedule regular health, dental, and eye exams.  Stay current with your vaccines.  Tell your health care provider if: ? You often feel depressed. ? You have ever been abused or do not feel safe at home. Summary  Adopting a healthy lifestyle and getting preventive care are important in promoting health and   wellness.  Follow your health care provider's instructions about healthy diet, exercising, and getting tested or screened for diseases.  Follow your health care provider's instructions on monitoring your cholesterol and blood pressure. This information is not intended to replace advice given to you by your health care provider. Make sure you discuss any questions you have with your health care provider. Document Revised: 06/12/2018 Document Reviewed: 06/12/2018 Elsevier Patient Education  2020 [employer]  

## 2019-11-06 NOTE — Progress Notes (Signed)
5/6/202110:28 AM  Lysle Rubens [birth year], 39 y.o., female [id number]  Chief Complaint  Patient presents with  . Annual Exam    pt would like STI testing as a general basis, pt would also like her TSH chacked again. Pt is feeling well     HPI:   Patient is a 39 y.o. female with past medical history significant for PCOS and HTN who presents today for CPE  Last CPE Jan 2020 G&Ps: 0 Pap: Dr Tressia Danas at [facility], scheduled 11/2019 STD: requesting testing BC : OCPs Menses: regular on OCPs Mammogram: started last year, managed by obgyn FHX breast/ovarian cancer: none FHx colon cancer: none Exercise/diet: has gained weight during covid, not walking as much, not tracking calories Had recent derm and eye appt Has upcoming dental appt Feels hearing has decreased, having to ask people to repeat themselves constantly Goes to therapy once a month  Most Recent Immunizations  Administered Date(s) Administered  . Influenza,inj,Quad PF,6+ Mos 07/19/2018  . Influenza-Unspecified 04/02/2016  . PFIZER SARS-COV-2 Vaccination 09/30/2019  . Tdap 07/25/2016  completed covid vaccine  Depression screen [facility] 2/9 11/06/2019 07/19/2018 04/23/2018  Decreased Interest 0 0 0  Down, Depressed, Hopeless 0 0 0  PHQ - 2 Score 0 0 0    Fall Risk  11/06/2019 07/19/2018 04/23/2018 01/09/2018 11/12/2017  Falls in the past year? 0 0 No No No  Follow up Falls evaluation completed - - - -     No Known Allergies  Prior to Admission medications   Medication Sig Start Date End Date Taking? Authorizing Provider  ACETAMINOPHEN PO Take by mouth as needed.   Yes [provider]  Orlena Sheldon 0.1-20 MG-MCG(21) TABS  08/31/17  Yes [provider]  Cholecalciferol (VITAMIN D) 2000 units CAPS Take 2,800 Units by mouth daily.    Yes [provider]  Ferrous Sulfate (IRON SUPPLEMENT PO) Take by mouth daily.   Yes [provider]  metoprolol succinate (TOPROL-XL) 25 MG 24 hr tablet  TAKE 1 TABLET BY MOUTH EVERYDAY AT BEDTIME 09/02/19  Yes Rutherford Guys, MD    Past Medical History:  Diagnosis Date  . Hypertension   . Kidney stones   . PCOS (polycystic ovarian syndrome)   . Vitamin D deficiency     Past Surgical History:  Procedure Laterality Date  . MOLE REMOVAL     BACK    Social History   Tobacco Use  . Smoking status: Never Smoker  . Smokeless tobacco: Never Used  Substance Use Topics  . Alcohol use: No    Family History  Problem Relation Age of Onset  . Hypertension Mother   . Hypertension Father   . Diverticulitis Father         PARTIAL COLON REMOVED  . Heart disease Maternal Grandmother   . Heart disease Paternal Grandmother     Review of Systems  Constitutional: Negative for chills, fever, malaise/fatigue and weight loss.  HENT: Positive for hearing loss. Negative for ear pain and tinnitus.   Eyes: Negative for blurred vision and double vision.  Respiratory: Negative for cough and shortness of breath.   Cardiovascular: Negative for chest pain, palpitations and leg swelling.  Gastrointestinal: Negative for abdominal pain, blood in stool, constipation, diarrhea, melena, nausea and vomiting.  Genitourinary: Negative for dysuria, frequency and urgency.  Neurological: Negative for dizziness, sensory change, focal weakness and headaches.  Endo/Heme/Allergies: Negative for polydipsia.  Psychiatric/Behavioral: Negative for depression. The patient is not nervous/anxious and does not have insomnia.  All other systems reviewed and are negative. per hpi   OBJECTIVE:  Today's Vitals   11/06/19 1018  BP: 115/82  Pulse: 80  Resp: 15  Temp: 98.4 F (36.9 C)  TempSrc: Temporal  SpO2: 97%  Weight: 193 lb 3.2 oz (87.6 kg)  Height: 5' 4.75" (1.645 m)   Body mass index is 32.4 kg/m.   Wt Readings from Last 3 Encounters:  11/06/19 193 lb 3.2 oz (87.6 kg)  07/19/18 183 lb 8 oz (83.2 kg)  04/23/18 174 lb 3.2 oz (79 kg)    Hearing  Screening   '125Hz'  '250Hz'  '500Hz'  '1000Hz'  '2000Hz'  '3000Hz'  '4000Hz'  '6000Hz'  '8000Hz'   Right ear:           Left ear:             Visual Acuity Screening   Right eye Left eye Both eyes  Without correction:     With correction: 20/20-1 20/15 20/15    Physical Exam Vitals and nursing note reviewed.  Constitutional:      Appearance: She is well-developed.  HENT:     Head: Normocephalic and atraumatic.     Right Ear: Hearing, tympanic membrane, ear canal and external ear normal.     Left Ear: Hearing, tympanic membrane, ear canal and external ear normal.     Mouth/Throat:     Mouth: Mucous membranes are moist.     Pharynx: No oropharyngeal exudate or posterior oropharyngeal erythema.  Eyes:     Extraocular Movements: Extraocular movements intact.     Conjunctiva/sclera: Conjunctivae normal.     Pupils: Pupils are equal, round, and reactive to light.  Neck:     Thyroid: No thyromegaly.  Cardiovascular:     Rate and Rhythm: Normal rate and regular rhythm.     Heart sounds: Normal heart sounds. No murmur. No friction rub. No gallop.   Pulmonary:     Effort: Pulmonary effort is normal.     Breath sounds: Normal breath sounds. No wheezing, rhonchi or rales.  Abdominal:     General: Bowel sounds are normal. There is no distension.     Palpations: Abdomen is soft. There is no hepatomegaly, splenomegaly or mass.     Tenderness: There is no abdominal tenderness.  Musculoskeletal:        General: Normal range of motion.     Cervical back: Neck supple.     Right lower leg: No edema.     Left lower leg: No edema.  Lymphadenopathy:     Cervical: No cervical adenopathy.  Skin:    General: Skin is warm and dry.  Neurological:     Mental Status: She is alert and oriented to person, place, and time.     Cranial Nerves: No cranial nerve deficit.     Gait: Gait normal.     Deep Tendon Reflexes: Reflexes are normal and symmetric.  Psychiatric:        Mood and Affect: Mood normal.        Behavior:  Behavior normal.     No results found for this or any previous visit (from the past 24 hour(s)).  No results found.   ASSESSMENT and PLAN  1. Annual physical exam Routine HCM labs ordered. HCM reviewed/discussed. Anticipatory guidance regarding healthy weight, lifestyle and choices given.   2. Screening examination for STD (sexually transmitted disease) - HIV antibody - Urine cytology ancillary only - RPR  3. Decreased hearing of both ears - Ambulatory referral to Audiology  4. Screening for  deficiency anemia - CBC  5. Screening for lipoid disorders - Lipid panel  6. Screening for endocrine, metabolic and immunity disorder - CMP14+EGFR - Hemoglobin A1c - TSH  Return in 1 year (on 11/05/2020).    Rutherford Guys, MD Primary Care at [place], [place] [address] Ph.  [phone] Fax [phone]

## 2019-11-07 LAB — CMP14+EGFR
ALT: 16 IU/L (ref 0–32)
AST: 16 IU/L (ref 0–40)
Albumin/Globulin Ratio: 1.6 (ref 1.2–2.2)
Albumin: 4.2 g/dL (ref 3.8–4.8)
Alkaline Phosphatase: 85 IU/L (ref 39–117)
BUN/Creatinine Ratio: 12 (ref 9–23)
BUN: 10 mg/dL (ref 6–24)
Bilirubin Total: 1 mg/dL (ref 0.0–1.2)
CO2: 21 mmol/L (ref 20–29)
Calcium: 9.4 mg/dL (ref 8.7–10.2)
Chloride: 105 mmol/L (ref 96–106)
Creatinine, Ser: 0.82 mg/dL (ref 0.57–1.00)
GFR calc Af Amer: 103 mL/min/{1.73_m2} (ref >59)
GFR calc non Af Amer: 89 mL/min/{1.73_m2} (ref >59)
Globulin, Total: 2.6 g/dL (ref 1.5–4.5)
Glucose: 84 mg/dL (ref 65–99)
Potassium: 4.7 mmol/L (ref 3.5–5.2)
Sodium: 140 mmol/L (ref 134–144)
Total Protein: 6.8 g/dL (ref 6.0–8.5)

## 2019-11-07 LAB — CBC
Hematocrit: 44.3 % (ref 34.0–46.6)
Hemoglobin: 15.1 g/dL (ref 11.1–15.9)
MCH: 32.2 pg (ref 26.6–33.0)
MCHC: 34.1 g/dL (ref 31.5–35.7)
MCV: 95 fL (ref 79–97)
Platelets: 343 10*3/uL (ref 150–450)
RBC: 4.69 x10E6/uL (ref 3.77–5.28)
RDW: 11.8 % (ref 11.7–15.4)
WBC: 7.7 10*3/uL (ref 3.4–10.8)

## 2019-11-07 LAB — LIPID PANEL
Chol/HDL Ratio: 2.8 ratio (ref 0.0–4.4)
Cholesterol, Total: 166 mg/dL (ref 100–199)
HDL: 60 mg/dL (ref >39)
LDL Chol Calc (NIH): 93 mg/dL (ref 0–99)
Triglycerides: 66 mg/dL (ref 0–149)
VLDL Cholesterol Cal: 13 mg/dL (ref 5–40)

## 2019-11-07 LAB — HEMOGLOBIN A1C
Est. average glucose Bld gHb Est-mCnc: 100 mg/dL
Hgb A1c MFr Bld: 5.1 % (ref 4.8–5.6)

## 2019-11-07 LAB — TSH: TSH: 0.854 u[IU]/mL (ref 0.450–4.500)

## 2019-11-07 LAB — HIV ANTIBODY (ROUTINE TESTING W REFLEX): HIV Screen 4th Generation wRfx: NONREACTIVE

## 2019-11-07 LAB — RPR: RPR Ser Ql: NONREACTIVE

## 2019-11-27 ENCOUNTER — Other Ambulatory Visit: Payer: Self-pay

## 2019-11-27 ENCOUNTER — Ambulatory Visit: Payer: BC Managed Care – PPO | Attending: Family Medicine | Admitting: Audiologist

## 2019-11-27 DIAGNOSIS — H9193 Unspecified hearing loss, bilateral: Secondary | ICD-10-CM | POA: Insufficient documentation

## 2019-11-27 NOTE — Procedures (Signed)
  Outpatient Audiology and [facility] [address] [place], [place]  [address] [phone]  AUDIOLOGICAL  EVALUATION  NAME: Margaret Rice     DOB:   1981/05/27      MRN: [id number]                                                                                     DATE: 11/27/2019     REFERENT: Myles Lipps, MD STATUS: Outpatient DIAGNOSIS: Decreased Hearing    History: [name] was seen for an audiological evaluation.  [name] has difficulty hearing in background noise, crowds, and when people are at a distance. This difficulty has started gradually. She feels maybe her left ear is worse. It is harder for her to hear low voiced people. No pain or pressure reported in either ear.  No tinnitus in either ear. No family history of hearing loss. No history of ear infections. No kidney disease or diabetes. No other relevant case history reported.   Evaluation:   Otoscopy showed a clear view of the tympanic membranes, bilaterally  Tympanometry results were consistent with normal function of the middle ear, bilaterally   Audiometric testing was completed using conventional audiometry with ER3A transducer. Speech Detection Thresholds were consistent with pure tone averages. Word Recognition was excellent at conversation level. Pure tone thresholds show normal hearing for both ears. QuickSIN shows normal ability to hear in noise.   Results:  The test results were reviewed with [name].  She has excellent hearing in both ears. We discussed the role of stress and cognitive load in hearing in noise. She is also working from home, and working in silence for most of the day. This could contribute to sensory overload when [name] leaves the house. This can be likened to flipping on the lights after sitting in the dark all day, the brain takes time to adjust to sudden changes in noise level just as the eyes do to sudden changes in light. [name] reported understanding what was addressed  today.  Recommendations: 1.   No further audiologic testing is needed unless future hearing concerns arise.    Ammie Ferrier  Audiologist, Au.D., CCC-A 11/27/2019  8:39 AM  Cc: Myles Lipps, MD

## 2019-12-23 ENCOUNTER — Telehealth: Payer: Self-pay | Admitting: Family Medicine

## 2019-12-23 ENCOUNTER — Other Ambulatory Visit: Payer: Self-pay | Admitting: Family Medicine

## 2019-12-23 DIAGNOSIS — I10 Essential (primary) hypertension: Secondary | ICD-10-CM

## 2019-12-23 MED ORDER — METOPROLOL SUCCINATE ER 25 MG PO TB24: ORAL_TABLET | ORAL | 1 refills | Status: DC

## 2019-12-23 NOTE — Telephone Encounter (Signed)
Copied from CRM [id number]. Topic: Quick Communication - Rx Refill/Question >> Dec 23, 2019  8:18 AM Jaquita Rector A wrote: Medication: metoprolol succinate (TOPROL-XL) 25 MG 24 hr tablet    Has the patient contacted their pharmacy? Yes.   (Agent: If no, request that the patient contact the pharmacy for the refill.) (Agent: If yes, when and what did the pharmacy advise?)  Preferred Pharmacy (with phone number or street name): [id number] [address], [place]  Phone:  [phone] Fax:  [phone]     Agent: Please be advised that RX refills may take up to 3 business days. We ask that you follow-up with your pharmacy.

## 2019-12-23 NOTE — Telephone Encounter (Signed)
metoprolol succinate (TOPROL-XL) 25 MG 24 hr tablet  Please refill and resubmit to  [id number] [place] [address], [place] - [address] Phone:  [phone]  Fax:  [phone]     PT says rx told her needed to contact dr. She is assuming since no notes in our file that the RX was seeing a note from her last request and she had been told she needed a CPE and was completed on 11/06/19. If not refilling please FU with pt at [phone] with any further instruction.

## 2019-12-23 NOTE — Telephone Encounter (Signed)
Duplicate request

## 2020-06-22 ENCOUNTER — Other Ambulatory Visit: Payer: Self-pay

## 2020-06-22 ENCOUNTER — Telehealth: Payer: Self-pay | Admitting: Family Medicine

## 2020-06-22 DIAGNOSIS — I10 Essential (primary) hypertension: Secondary | ICD-10-CM

## 2020-06-22 MED ORDER — METOPROLOL SUCCINATE ER 25 MG PO TB24: ORAL_TABLET | ORAL | 0 refills | Status: DC

## 2020-06-22 NOTE — Telephone Encounter (Signed)
Medication Refill - Medication: metoprolol succinate (TOPROL-XL) 25 MG 24 hr tablet   Has the patient contacted their pharmacy? Yes.   (Agent: If no, request that the patient contact the pharmacy for the refill.) (Agent: If yes, when and what did the pharmacy advise?)  Preferred Pharmacy (with phone number or street name):  [id number] [place] [address], [place] - [address]  [address] [place] [address]  Phone: [phone] Fax: [phone]     Agent: Please be advised that RX refills may take up to 3 business days. We ask that you follow-up with your pharmacy.

## 2020-09-06 ENCOUNTER — Telehealth: Payer: Self-pay | Admitting: Family Medicine

## 2020-09-06 NOTE — Telephone Encounter (Signed)
What is the name of the medication? metoprolol succinate (TOPROL-XL) 25 MG 24 hr tablet [[id number]]    Have you contacted your pharmacy to request a refill? Pt would like refill on this script. I offered her appointment, she wanted to see if she would get a refill without having to come in.   Which pharmacy would you like this sent to? Pharmacy  [phone] [place] [address], [place] - [address]  [address] [place] [address]  Phone:  [phone] Fax:  [phone]  DEA #:  [id number]      Patient notified that their request is being sent to the clinical staff for review and that they should receive a call once it is complete. If they do not receive a call within 72 hours they can check with their pharmacy or our office.

## 2020-09-08 ENCOUNTER — Ambulatory Visit: Payer: Managed Care, Other (non HMO) | Admitting: Family Medicine

## 2020-09-08 ENCOUNTER — Other Ambulatory Visit: Payer: Self-pay

## 2020-09-08 ENCOUNTER — Encounter: Payer: Self-pay | Admitting: Family Medicine

## 2020-09-08 VITALS — BP 120/79 | HR 76 | Temp 98.0°F | Ht 64.75 in | Wt 199.0 lb

## 2020-09-08 DIAGNOSIS — E282 Polycystic ovarian syndrome: Secondary | ICD-10-CM | POA: Diagnosis not present

## 2020-09-08 DIAGNOSIS — I10 Essential (primary) hypertension: Secondary | ICD-10-CM

## 2020-09-08 DIAGNOSIS — D509 Iron deficiency anemia, unspecified: Secondary | ICD-10-CM | POA: Diagnosis not present

## 2020-09-08 DIAGNOSIS — E559 Vitamin D deficiency, unspecified: Secondary | ICD-10-CM

## 2020-09-08 MED ORDER — METOPROLOL SUCCINATE ER 25 MG PO TB24: ORAL_TABLET | ORAL | 3 refills | Status: AC

## 2020-09-08 NOTE — Telephone Encounter (Signed)
Called and left VM

## 2020-09-08 NOTE — Patient Instructions (Signed)
Health Maintenance, Female Adopting a healthy lifestyle and getting preventive care are important in promoting health and wellness. Ask your health care provider about:  The right schedule for you to have regular tests and exams.  Things you can do on your own to prevent diseases and keep yourself healthy. What should I know about diet, weight, and exercise? Eat a healthy diet  Eat a diet that includes plenty of vegetables, fruits, low-fat dairy products, and lean protein.  Do not eat a lot of foods that are high in solid fats, added sugars, or sodium.   Maintain a healthy weight Body mass index (BMI) is used to identify weight problems. It estimates body fat based on height and weight. Your health care provider can help determine your BMI and help you achieve or maintain a healthy weight. Get regular exercise Get regular exercise. This is one of the most important things you can do for your health. Most adults should:  Exercise for at least 150 minutes each week. The exercise should increase your heart rate and make you sweat (moderate-intensity exercise).  Do strengthening exercises at least twice a week. This is in addition to the moderate-intensity exercise.  Spend less time sitting. Even light physical activity can be beneficial. Watch cholesterol and blood lipids Have your blood tested for lipids and cholesterol at 42 years of age, then have this test every 5 years. Have your cholesterol levels checked more often if:  Your lipid or cholesterol levels are high.  You are older than 42 years of age.  You are at high risk for heart disease. What should I know about cancer screening? Depending on your health history and family history, you may need to have cancer screening at various ages. This may include screening for:  Breast cancer.  Cervical cancer.  Colorectal cancer.  Skin cancer.  Lung cancer. What should I know about heart disease, diabetes, and high blood  pressure? Blood pressure and heart disease  High blood pressure causes heart disease and increases the risk of stroke. This is more likely to develop in people who have high blood pressure readings, are of African descent, or are overweight.  Have your blood pressure checked: ? Every 3-5 years if you are 18-39 years of age. ? Every year if you are 40 years old or older. Diabetes Have regular diabetes screenings. This checks your fasting blood sugar level. Have the screening done:  Once every three years after age 40 if you are at a normal weight and have a low risk for diabetes.  More often and at a younger age if you are overweight or have a high risk for diabetes. What should I know about preventing infection? Hepatitis B If you have a higher risk for hepatitis B, you should be screened for this virus. Talk with your health care provider to find out if you are at risk for hepatitis B infection. Hepatitis C Testing is recommended for:  Everyone born from 1945 through 1965.  Anyone with known risk factors for hepatitis C. Sexually transmitted infections (STIs)  Get screened for STIs, including gonorrhea and chlamydia, if: ? You are sexually active and are younger than 42 years of age. ? You are older than 42 years of age and your health care provider tells you that you are at risk for this type of infection. ? Your sexual activity has changed since you were last screened, and you are at increased risk for chlamydia or gonorrhea. Ask your health care provider   if you are at risk.  Ask your health care provider about whether you are at high risk for HIV. Your health care provider may recommend a prescription medicine to help prevent HIV infection. If you choose to take medicine to prevent HIV, you should first get tested for HIV. You should then be tested every 3 months for as long as you are taking the medicine. Pregnancy  If you are about to stop having your period (premenopausal) and  you may become pregnant, seek counseling before you get pregnant.  Take 400 to 800 micrograms (mcg) of folic acid every day if you become pregnant.  Ask for birth control (contraception) if you want to prevent pregnancy. Osteoporosis and menopause Osteoporosis is a disease in which the bones lose minerals and strength with aging. This can result in bone fractures. If you are 65 years old or older, or if you are at risk for osteoporosis and fractures, ask your health care provider if you should:  Be screened for bone loss.  Take a calcium or vitamin D supplement to lower your risk of fractures.  Be given hormone replacement therapy (HRT) to treat symptoms of menopause. Follow these instructions at home: Lifestyle  Do not use any products that contain nicotine or tobacco, such as cigarettes, e-cigarettes, and chewing tobacco. If you need help quitting, ask your health care provider.  Do not use street drugs.  Do not share needles.  Ask your health care provider for help if you need support or information about quitting drugs. Alcohol use  Do not drink alcohol if: ? Your health care provider tells you not to drink. ? You are pregnant, may be pregnant, or are planning to become pregnant.  If you drink alcohol: ? Limit how much you use to 0-1 drink a day. ? Limit intake if you are breastfeeding.  Be aware of how much alcohol is in your drink. In the [place] one drink equals one 12 oz bottle of beer (355 mL), one 5 oz glass of wine (148 mL), or one 1 oz glass of hard liquor (44 mL). General instructions  Schedule regular health, dental, and eye exams.  Stay current with your vaccines.  Tell your health care provider if: ? You often feel depressed. ? You have ever been abused or do not feel safe at home. Summary  Adopting a healthy lifestyle and getting preventive care are important in promoting health and wellness.  Follow your health care provider's instructions about healthy  diet, exercising, and getting tested or screened for diseases.  Follow your health care provider's instructions on monitoring your cholesterol and blood pressure. This information is not intended to replace advice given to you by your health care provider. Make sure you discuss any questions you have with your health care provider. Document Revised: 06/12/2018 Document Reviewed: 06/12/2018 Elsevier Patient Education  2021 Elsevier Inc.  

## 2020-09-08 NOTE — Progress Notes (Signed)
3/9/20224:10 PM  Margaret Rice 05/16/1981, 40 y.o., female [id number]  Chief Complaint  Patient presents with  . Hypertension    Medication refills     HPI:   Patient is a 40 y.o. female with past medical history significant for PCOS and HTN who presents today for medication refills.  A few weeks ago started own weight loss program Using lose it app Trying to incorporate healthy snacks Increasing walking and adding weekly exercise   HTN Metoprolol 24m daily Was placed on for palpitations Has been a few weeks since happened Happens every couple weeks Normally occurs in the evenings This is better than what she had prior to starting medication Had tried higher dose of metoprolol but didn't feel well with that Checks BP at home today 110/79 BP Readings from Last 3 Encounters:  09/08/20 120/79  11/06/19 115/82  07/19/18 121/84    Is followed [facility] GYN [facility]  [facility] 11/06/2019 07/19/2018 04/23/2018  Decreased Interest 0 0 0  Down, Depressed, Hopeless 0 0 0  PHQ - 2 Score 0 0 0    Fall Risk  11/06/2019 07/19/2018 04/23/2018 01/09/2018 11/12/2017  Falls in the past year? 0 0 No No No  Follow up Falls evaluation completed - - - -     No Known Allergies  Prior to Admission medications   Medication Sig Start Date End Date Taking? Authorizing Provider  ACETAMINOPHEN PO Take by mouth as needed.   Yes [provider]  Cholecalciferol (VITAMIN D) 2000 units CAPS Take 2,800 Units by mouth daily.    Yes [provider]  Ferrous Sulfate (IRON SUPPLEMENT PO) Take by mouth daily.   Yes [provider]  metoprolol succinate (TOPROL-XL) 25 MG 24 hr tablet TAKE 1 TABLET BY MOUTH EVERYDAY AT BEDTIME 06/22/20  Yes Lenell Lama, KLaurita Quint FNP  norethindrone-ethinyl estradiol (BALZIVA) 0.4-35 MG-MCG tablet Take 1 tablet by mouth daily.   Yes [provider]    Past Medical History:  Diagnosis  Date  . Hypertension   . Kidney stones   . PCOS (polycystic ovarian syndrome)   . Vitamin D deficiency     Past Surgical History:  Procedure Laterality Date  . MOLE REMOVAL     BACK    Social History   Tobacco Use  . Smoking status: Never Smoker  . Smokeless tobacco: Never Used  Substance Use Topics  . Alcohol use: No    Family History  Problem Relation Age of Onset  . Hypertension Mother   . Hypertension Father   . Diverticulitis Father         PARTIAL COLON REMOVED  . Heart disease Maternal Grandmother   . Heart disease Paternal Grandmother     Review of Systems  Constitutional: Negative for chills, fever and malaise/fatigue.  Eyes: Negative for blurred vision and double vision.  Respiratory: Negative for cough, shortness of breath and wheezing.   Cardiovascular: Positive for palpitations. Negative for chest pain and leg swelling.  Gastrointestinal: Negative for abdominal pain, constipation, diarrhea, heartburn, nausea and vomiting.  Genitourinary: Negative for dysuria, frequency and hematuria.  Musculoskeletal: Negative for back pain and joint pain.  Skin: Negative for rash.  Neurological: Negative for dizziness, weakness and headaches.     OBJECTIVE:  Today's Vitals   09/08/20 1543  BP: 120/79  Pulse: 76  Temp: 98 F (36.7 C)  SpO2: 98%  Weight: 199 lb (90.3 kg)  Height:  5' 4.75" (1.645 m)   Body mass index is 33.37 kg/m. Wt Readings from Last 3 Encounters:  09/08/20 199 lb (90.3 kg)  11/06/19 193 lb 3.2 oz (87.6 kg)  07/19/18 183 lb 8 oz (83.2 kg)     Physical Exam Constitutional:      General: She is not in acute distress.    Appearance: Normal appearance. She is not ill-appearing.  HENT:     Head: Normocephalic.  Cardiovascular:     Rate and Rhythm: Normal rate and regular rhythm.     Pulses: Normal pulses.     Heart sounds: Normal heart sounds. No murmur heard. No friction rub. No gallop.   Pulmonary:     Effort: Pulmonary effort  is normal. No respiratory distress.     Breath sounds: Normal breath sounds. No stridor. No wheezing, rhonchi or rales.  Abdominal:     General: Bowel sounds are normal.     Palpations: Abdomen is soft.     Tenderness: There is no abdominal tenderness.  Musculoskeletal:     Right lower leg: No edema.     Left lower leg: No edema.  Skin:    General: Skin is warm and dry.  Neurological:     Mental Status: She is alert and oriented to person, place, and time.  Psychiatric:        Mood and Affect: Mood normal.        Behavior: Behavior normal.     No results found for this or any previous visit (from the past 24 hour(s)).  No results found.   ASSESSMENT and PLAN  Problem List Items Addressed This Visit      Cardiovascular and Mediastinum   Essential hypertension   Relevant Medications   metoprolol succinate (TOPROL-XL) 25 MG 24 hr tablet   Other Relevant Orders   CMP14+EGFR     Endocrine   PCOS (polycystic ovarian syndrome) - Primary   Relevant Orders   Hemoglobin A1c   Lipid Panel   TSH    Other Visit Diagnoses    Vitamin D deficiency       Relevant Orders   Vitamin D, 25-hydroxy   Iron deficiency anemia, unspecified iron deficiency anemia type       Relevant Orders   CBC   Iron, TIBC and Ferritin Panel     Plan . Medication refills sent . Will follow up with lab work   Return if symptoms worsen or fail to improve.    Huston Foley Luciann Gossett, FNP-BC Primary Care at [place], [place] [address] Ph.  [phone] Fax [phone]

## 2020-09-09 ENCOUNTER — Ambulatory Visit (INDEPENDENT_AMBULATORY_CARE_PROVIDER_SITE_OTHER): Payer: Managed Care, Other (non HMO) | Admitting: Family Medicine

## 2020-09-09 DIAGNOSIS — I10 Essential (primary) hypertension: Secondary | ICD-10-CM

## 2020-09-09 NOTE — Addendum Note (Signed)
Addended by: Argentina Ponder on: 09/09/2020 09:18 AM   Modules accepted: Orders

## 2020-09-10 LAB — CMP14+EGFR
ALT: 30 IU/L (ref 0–32)
AST: 18 IU/L (ref 0–40)
Albumin/Globulin Ratio: 1.7 (ref 1.2–2.2)
Albumin: 4.1 g/dL (ref 3.8–4.8)
Alkaline Phosphatase: 71 IU/L (ref 44–121)
BUN/Creatinine Ratio: 10 (ref 9–23)
BUN: 8 mg/dL (ref 6–24)
Bilirubin Total: 0.5 mg/dL (ref 0.0–1.2)
CO2: 21 mmol/L (ref 20–29)
Calcium: 8.9 mg/dL (ref 8.7–10.2)
Chloride: 103 mmol/L (ref 96–106)
Creatinine, Ser: 0.8 mg/dL (ref 0.57–1.00)
Globulin, Total: 2.4 g/dL (ref 1.5–4.5)
Glucose: 85 mg/dL (ref 65–99)
Potassium: 4.3 mmol/L (ref 3.5–5.2)
Sodium: 137 mmol/L (ref 134–144)
Total Protein: 6.5 g/dL (ref 6.0–8.5)
eGFR: 94 mL/min/{1.73_m2} (ref >59)

## 2020-09-10 LAB — LIPID PANEL
Chol/HDL Ratio: 3.3 ratio (ref 0.0–4.4)
Cholesterol, Total: 193 mg/dL (ref 100–199)
HDL: 59 mg/dL (ref >39)
LDL Chol Calc (NIH): 117 mg/dL — ABNORMAL HIGH (ref 0–99)
Triglycerides: 97 mg/dL (ref 0–149)
VLDL Cholesterol Cal: 17 mg/dL (ref 5–40)

## 2020-09-10 LAB — CBC
Hematocrit: 42.9 % (ref 34.0–46.6)
Hemoglobin: 14.2 g/dL (ref 11.1–15.9)
MCH: 31.8 pg (ref 26.6–33.0)
MCHC: 33.1 g/dL (ref 31.5–35.7)
MCV: 96 fL (ref 79–97)
Platelets: 293 10*3/uL (ref 150–450)
RBC: 4.47 x10E6/uL (ref 3.77–5.28)
RDW: 11.8 % (ref 11.7–15.4)
WBC: 5.9 10*3/uL (ref 3.4–10.8)

## 2020-09-10 LAB — VITAMIN D 25 HYDROXY (VIT D DEFICIENCY, FRACTURES): Vit D, 25-Hydroxy: 37.5 ng/mL (ref 30.0–100.0)

## 2020-09-10 LAB — IRON,TIBC AND FERRITIN PANEL
Ferritin: 40 ng/mL (ref 15–150)
Iron Saturation: 29 % (ref 15–55)
Iron: 112 ug/dL (ref 27–159)
Total Iron Binding Capacity: 390 ug/dL (ref 250–450)
UIBC: 278 ug/dL (ref 131–425)

## 2020-09-10 LAB — HEMOGLOBIN A1C
Est. average glucose Bld gHb Est-mCnc: 103 mg/dL
Hgb A1c MFr Bld: 5.2 % (ref 4.8–5.6)

## 2020-09-10 LAB — TSH: TSH: 0.716 u[IU]/mL (ref 0.450–4.500)

## 2020-10-26 ENCOUNTER — Emergency Department (HOSPITAL_COMMUNITY): Payer: Managed Care, Other (non HMO)

## 2020-10-26 ENCOUNTER — Observation Stay (HOSPITAL_COMMUNITY)
Admission: EM | Admit: 2020-10-26 | Discharge: 2020-10-30 | Disposition: A | Discharge status: Home / Self Care | Payer: Managed Care, Other (non HMO) | Attending: Urology | Admitting: Urology

## 2020-10-26 ENCOUNTER — Observation Stay (HOSPITAL_COMMUNITY): Payer: Managed Care, Other (non HMO)

## 2020-10-26 ENCOUNTER — Other Ambulatory Visit: Payer: Self-pay

## 2020-10-26 ENCOUNTER — Encounter (HOSPITAL_COMMUNITY): Payer: Self-pay | Admitting: General Practice

## 2020-10-26 ENCOUNTER — Encounter (HOSPITAL_COMMUNITY)
Admission: EM | Disposition: A | Discharge status: Home / Self Care | Payer: Self-pay | Source: Home / Self Care | Attending: Emergency Medicine

## 2020-10-26 DIAGNOSIS — Z20822 Contact with and (suspected) exposure to covid-19: Secondary | ICD-10-CM | POA: Insufficient documentation

## 2020-10-26 DIAGNOSIS — N132 Hydronephrosis with renal and ureteral calculous obstruction: Secondary | ICD-10-CM | POA: Diagnosis not present

## 2020-10-26 DIAGNOSIS — N390 Urinary tract infection, site not specified: Secondary | ICD-10-CM

## 2020-10-26 DIAGNOSIS — I10 Essential (primary) hypertension: Secondary | ICD-10-CM | POA: Insufficient documentation

## 2020-10-26 DIAGNOSIS — R109 Unspecified abdominal pain: Secondary | ICD-10-CM | POA: Diagnosis present

## 2020-10-26 DIAGNOSIS — Z79899 Other long term (current) drug therapy: Secondary | ICD-10-CM | POA: Diagnosis not present

## 2020-10-26 DIAGNOSIS — N200 Calculus of kidney: Secondary | ICD-10-CM

## 2020-10-26 DIAGNOSIS — R52 Pain, unspecified: Secondary | ICD-10-CM

## 2020-10-26 HISTORY — PX: CYSTOSCOPY/URETEROSCOPY/HOLMIUM LASER/STENT PLACEMENT: SHX6546

## 2020-10-26 LAB — CBC WITH DIFFERENTIAL/PLATELET
Abs Immature Granulocytes: 0.07 10*3/uL (ref 0.00–0.07)
Basophils Absolute: 0.1 10*3/uL (ref 0.0–0.1)
Basophils Relative: 1 %
Eosinophils Absolute: 0.1 10*3/uL (ref 0.0–0.5)
Eosinophils Relative: 0 %
HCT: 44.6 % (ref 36.0–46.0)
Hemoglobin: 14.8 g/dL (ref 12.0–15.0)
Immature Granulocytes: 1 %
Lymphocytes Relative: 12 %
Lymphs Abs: 1.6 10*3/uL (ref 0.7–4.0)
MCH: 31.8 pg (ref 26.0–34.0)
MCHC: 33.2 g/dL (ref 30.0–36.0)
MCV: 95.7 fL (ref 80.0–100.0)
Monocytes Absolute: 0.4 10*3/uL (ref 0.1–1.0)
Monocytes Relative: 3 %
Neutro Abs: 11.4 10*3/uL — ABNORMAL HIGH (ref 1.7–7.7)
Neutrophils Relative %: 83 %
Platelets: 327 10*3/uL (ref 150–400)
RBC: 4.66 MIL/uL (ref 3.87–5.11)
RDW: 12.2 % (ref 11.5–15.5)
WBC: 13.6 10*3/uL — ABNORMAL HIGH (ref 4.0–10.5)
nRBC: 0 % (ref 0.0–0.2)

## 2020-10-26 LAB — URINALYSIS, ROUTINE W REFLEX MICROSCOPIC
Bilirubin Urine: NEGATIVE
Glucose, UA: NEGATIVE mg/dL
Ketones, ur: 20 mg/dL — AB
Nitrite: NEGATIVE
Protein, ur: NEGATIVE mg/dL
Specific Gravity, Urine: 1.02 (ref 1.005–1.030)
WBC, UA: >50 WBC/hpf — ABNORMAL HIGH (ref 0–5)
pH: 6 (ref 5.0–8.0)

## 2020-10-26 LAB — COMPREHENSIVE METABOLIC PANEL
ALT: 18 U/L (ref 0–44)
AST: 16 U/L (ref 15–41)
Albumin: 3.7 g/dL (ref 3.5–5.0)
Alkaline Phosphatase: 77 U/L (ref 38–126)
Anion gap: 8 (ref 5–15)
BUN: 18 mg/dL (ref 6–20)
CO2: 21 mmol/L — ABNORMAL LOW (ref 22–32)
Calcium: 9 mg/dL (ref 8.9–10.3)
Chloride: 109 mmol/L (ref 98–111)
Creatinine, Ser: 0.95 mg/dL (ref 0.44–1.00)
GFR, Estimated: >60 mL/min (ref >60)
Glucose, Bld: 124 mg/dL — ABNORMAL HIGH (ref 70–99)
Potassium: 3.6 mmol/L (ref 3.5–5.1)
Sodium: 138 mmol/L (ref 135–145)
Total Bilirubin: 0.8 mg/dL (ref 0.3–1.2)
Total Protein: 7.2 g/dL (ref 6.5–8.1)

## 2020-10-26 LAB — I-STAT BETA HCG BLOOD, ED (MC, WL, AP ONLY): I-stat hCG, quantitative: <5 m[IU]/mL (ref <5)

## 2020-10-26 LAB — RESP PANEL BY RT-PCR (FLU A&B, COVID) ARPGX2
Influenza A by PCR: NEGATIVE
Influenza B by PCR: NEGATIVE
SARS Coronavirus 2 by RT PCR: NEGATIVE

## 2020-10-26 LAB — LACTIC ACID, PLASMA
Lactic Acid, Venous: 1.9 mmol/L (ref 0.5–1.9)
Lactic Acid, Venous: 1 mmol/L (ref 0.5–1.9)

## 2020-10-26 SURGERY — CYSTOSCOPY/URETEROSCOPY/HOLMIUM LASER/STENT PLACEMENT
Anesthesia: General | Laterality: Left

## 2020-10-26 MED ORDER — SENNOSIDES-DOCUSATE SODIUM 8.6-50 MG PO TABS
1.0000 | ORAL_TABLET | Freq: Two times a day (BID) | ORAL | Status: DC
  Administered 2020-10-26 – 2020-10-29 (×7): 1 via ORAL
  Filled 2020-10-26 (×6): qty 1

## 2020-10-26 MED ORDER — SODIUM CHLORIDE 0.9 % IV SOLN
2.0000 g | Freq: Once | INTRAVENOUS | Status: AC
  Administered 2020-10-26: 2 g via INTRAVENOUS
  Filled 2020-10-26: qty 20

## 2020-10-26 MED ORDER — TAMSULOSIN HCL 0.4 MG PO CAPS
0.4000 mg | ORAL_CAPSULE | Freq: Once | ORAL | Status: AC
  Administered 2020-10-26: 0.4 mg via ORAL
  Filled 2020-10-26: qty 1

## 2020-10-26 MED ORDER — SODIUM CHLORIDE 0.9 % IV SOLN: INTRAVENOUS | Status: DC

## 2020-10-26 MED ORDER — ONDANSETRON HCL 4 MG/2ML IJ SOLN
4.0000 mg | Freq: Once | INTRAMUSCULAR | Status: AC
  Administered 2020-10-26: 4 mg via INTRAVENOUS
  Filled 2020-10-26: qty 2

## 2020-10-26 MED ORDER — SCOPOLAMINE 1 MG/3DAYS TD PT72
1.0000 | MEDICATED_PATCH | TRANSDERMAL | Status: DC
  Administered 2020-10-26: 1.5 mg via TRANSDERMAL

## 2020-10-26 MED ORDER — DEXAMETHASONE SODIUM PHOSPHATE 10 MG/ML IJ SOLN
INTRAMUSCULAR | Status: DC | PRN
  Administered 2020-10-26: 10 mg via INTRAVENOUS

## 2020-10-26 MED ORDER — SCOPOLAMINE 1 MG/3DAYS TD PT72
MEDICATED_PATCH | TRANSDERMAL | Status: AC
  Filled 2020-10-26: qty 1

## 2020-10-26 MED ORDER — OXYCODONE HCL 5 MG/5ML PO SOLN
5.0000 mg | Freq: Once | ORAL | Status: DC | PRN
Start: 2020-10-26 — End: 2020-10-26

## 2020-10-26 MED ORDER — PROPOFOL 10 MG/ML IV BOLUS
INTRAVENOUS | Status: AC
  Filled 2020-10-26: qty 20

## 2020-10-26 MED ORDER — HYDROMORPHONE HCL 1 MG/ML IJ SOLN
1.0000 mg | Freq: Once | INTRAMUSCULAR | Status: AC
  Administered 2020-10-26: 1 mg via INTRAVENOUS
  Filled 2020-10-26: qty 1

## 2020-10-26 MED ORDER — SUGAMMADEX SODIUM 200 MG/2ML IV SOLN
INTRAVENOUS | Status: DC | PRN
  Administered 2020-10-26: 400 mg via INTRAVENOUS

## 2020-10-26 MED ORDER — PANTOPRAZOLE SODIUM 40 MG IV SOLR
40.0000 mg | Freq: Once | INTRAVENOUS | Status: AC
  Administered 2020-10-26: 40 mg via INTRAVENOUS
  Filled 2020-10-26: qty 40

## 2020-10-26 MED ORDER — ONDANSETRON HCL 4 MG/2ML IJ SOLN
INTRAMUSCULAR | Status: AC
  Filled 2020-10-26: qty 2

## 2020-10-26 MED ORDER — OXYCODONE HCL 5 MG PO TABS
5.0000 mg | ORAL_TABLET | ORAL | Status: DC | PRN
Start: 2020-10-26 — End: 2020-10-30
  Administered 2020-10-27 – 2020-10-29 (×8): 5 mg via ORAL
  Filled 2020-10-26 (×8): qty 1

## 2020-10-26 MED ORDER — OXYCODONE HCL 5 MG PO TABS: 5.0000 mg | ORAL_TABLET | Freq: Once | ORAL | Status: DC | PRN

## 2020-10-26 MED ORDER — IOHEXOL 300 MG/ML  SOLN
INTRAMUSCULAR | Status: DC | PRN
  Administered 2020-10-26: 10 mL

## 2020-10-26 MED ORDER — FENTANYL CITRATE (PF) 100 MCG/2ML IJ SOLN
INTRAMUSCULAR | Status: DC | PRN
  Administered 2020-10-26: 100 ug via INTRAVENOUS

## 2020-10-26 MED ORDER — SODIUM CHLORIDE 0.9 % IR SOLN
Status: DC | PRN
  Administered 2020-10-26: 100 mL

## 2020-10-26 MED ORDER — KETOROLAC TROMETHAMINE 30 MG/ML IJ SOLN: 30.0000 mg | Freq: Once | INTRAMUSCULAR | Status: DC | PRN

## 2020-10-26 MED ORDER — SODIUM CHLORIDE 0.9 % IV SOLN
2.0000 g | INTRAVENOUS | Status: DC
  Administered 2020-10-27 – 2020-10-29 (×3): 2 g via INTRAVENOUS
  Filled 2020-10-26 (×2): qty 2
  Filled 2020-10-26: qty 20
  Filled 2020-10-26: qty 2

## 2020-10-26 MED ORDER — ACETAMINOPHEN 10 MG/ML IV SOLN
INTRAVENOUS | Status: AC
  Filled 2020-10-26: qty 100

## 2020-10-26 MED ORDER — MEPERIDINE HCL 50 MG/ML IJ SOLN: 6.2500 mg | INTRAMUSCULAR | Status: DC | PRN

## 2020-10-26 MED ORDER — SUCCINYLCHOLINE CHLORIDE 20 MG/ML IJ SOLN
INTRAMUSCULAR | Status: DC | PRN
  Administered 2020-10-26: 120 mg via INTRAVENOUS

## 2020-10-26 MED ORDER — PROMETHAZINE HCL 25 MG/ML IJ SOLN: 6.2500 mg | INTRAMUSCULAR | Status: DC | PRN

## 2020-10-26 MED ORDER — HYDROMORPHONE HCL 1 MG/ML IJ SOLN: 0.5000 mg | INTRAMUSCULAR | Status: DC | PRN

## 2020-10-26 MED ORDER — HYDROMORPHONE HCL 1 MG/ML IJ SOLN
1.0000 mg | INTRAMUSCULAR | Status: DC | PRN
  Administered 2020-10-26: 1 mg via INTRAVENOUS
  Filled 2020-10-26: qty 1

## 2020-10-26 MED ORDER — LACTATED RINGERS IV SOLN: INTRAVENOUS | Status: DC

## 2020-10-26 MED ORDER — ACETAMINOPHEN 10 MG/ML IV SOLN
INTRAVENOUS | Status: DC | PRN
  Administered 2020-10-26: 1000 mg via INTRAVENOUS

## 2020-10-26 MED ORDER — ACETAMINOPHEN 500 MG PO TABS
1000.0000 mg | ORAL_TABLET | Freq: Three times a day (TID) | ORAL | Status: AC
  Administered 2020-10-27 (×3): 1000 mg via ORAL
  Filled 2020-10-26 (×3): qty 2

## 2020-10-26 MED ORDER — ROCURONIUM BROMIDE 10 MG/ML (PF) SYRINGE
PREFILLED_SYRINGE | INTRAVENOUS | Status: DC | PRN
  Administered 2020-10-26: 30 mg via INTRAVENOUS

## 2020-10-26 MED ORDER — LIDOCAINE HCL (CARDIAC) PF 100 MG/5ML IV SOSY
PREFILLED_SYRINGE | INTRAVENOUS | Status: DC | PRN
  Administered 2020-10-26: 60 mg via INTRATRACHEAL

## 2020-10-26 MED ORDER — MIDAZOLAM HCL 2 MG/2ML IJ SOLN
INTRAMUSCULAR | Status: AC
  Filled 2020-10-26: qty 2

## 2020-10-26 MED ORDER — DEXAMETHASONE SODIUM PHOSPHATE 10 MG/ML IJ SOLN
INTRAMUSCULAR | Status: AC
  Filled 2020-10-26: qty 1

## 2020-10-26 MED ORDER — LACTATED RINGERS IV BOLUS
1000.0000 mL | Freq: Once | INTRAVENOUS | Status: AC
  Administered 2020-10-26: 1000 mL via INTRAVENOUS

## 2020-10-26 MED ORDER — FENTANYL CITRATE (PF) 250 MCG/5ML IJ SOLN
INTRAMUSCULAR | Status: AC
  Filled 2020-10-26: qty 5

## 2020-10-26 MED ORDER — HYDROMORPHONE HCL 1 MG/ML IJ SOLN
0.5000 mg | Freq: Once | INTRAMUSCULAR | Status: AC
  Administered 2020-10-26: 0.5 mg via INTRAVENOUS
  Filled 2020-10-26: qty 1

## 2020-10-26 MED ORDER — ONDANSETRON HCL 4 MG/2ML IJ SOLN
INTRAMUSCULAR | Status: DC | PRN
  Administered 2020-10-26: 4 mg via INTRAVENOUS

## 2020-10-26 MED ORDER — CHLORHEXIDINE GLUCONATE 0.12 % MT SOLN
15.0000 mL | OROMUCOSAL | Status: AC
  Administered 2020-10-26: 15 mL via OROMUCOSAL

## 2020-10-26 MED ORDER — PHENYLEPHRINE HCL (PRESSORS) 10 MG/ML IV SOLN
INTRAVENOUS | Status: DC | PRN
  Administered 2020-10-26: 80 ug via INTRAVENOUS

## 2020-10-26 MED ORDER — KETOROLAC TROMETHAMINE 30 MG/ML IJ SOLN
30.0000 mg | Freq: Once | INTRAMUSCULAR | Status: AC
  Administered 2020-10-26: 30 mg via INTRAVENOUS
  Filled 2020-10-26: qty 1

## 2020-10-26 MED ORDER — PROPOFOL 10 MG/ML IV BOLUS
INTRAVENOUS | Status: DC | PRN
  Administered 2020-10-26: 150 mg via INTRAVENOUS

## 2020-10-26 MED ORDER — HYDROMORPHONE HCL 1 MG/ML IJ SOLN: 0.2500 mg | INTRAMUSCULAR | Status: DC | PRN

## 2020-10-26 SURGICAL SUPPLY — 9 items
BAG URO CATCHER STRL LF (MISCELLANEOUS) ×2 IMPLANT
GLOVE SURG ENC TEXT LTX SZ7.5 (GLOVE) ×2 IMPLANT
GOWN STRL REUS W/TWL LRG LVL3 (GOWN DISPOSABLE) ×2 IMPLANT
KIT TURNOVER KIT A (KITS) ×2 IMPLANT
MANIFOLD NEPTUNE II (INSTRUMENTS) ×2 IMPLANT
PACK CYSTO (CUSTOM PROCEDURE TRAY) ×2 IMPLANT
STENT POLARIS 5FRX24 (STENTS) ×2 IMPLANT
TUBING CONNECTING 10 (TUBING) ×2 IMPLANT
TUBING UROLOGY SET (TUBING) ×2 IMPLANT

## 2020-10-26 NOTE — Brief Op Note (Signed)
10/26/2020  5:57 PM  PATIENT:  Margaret Rice  40 y.o. female  PRE-OPERATIVE DIAGNOSIS:  LEFT URETERAL STONE  POST-OPERATIVE DIAGNOSIS:  LEFT URETERAL STONE  PROCEDURE:  Procedure(s): CYSTOSCOPY/URETEROSCOPY/HOLMIUM LASER/STENT PLACEMENT (Left)  SURGEON:  Surgeon(s) and Role:    * [name], MD - Primary  PHYSICIAN ASSISTANT:   ASSISTANTS: none   ANESTHESIA:   general  EBL:  minimal   BLOOD ADMINISTERED:none  DRAINS: none   LOCAL MEDICATIONS USED:  NONE  SPECIMEN:  No Specimen  DISPOSITION OF SPECIMEN:  N/A  COUNTS:  YES  TOURNIQUET:  * No tourniquets in log *  DICTATION: .Other Dictation: Dictation Number [id number]  PLAN OF CARE: Admit for overnight observation  PATIENT DISPOSITION:  PACU - hemodynamically stable.   Delay start of Pharmacological VTE agent (>24hrs) due to surgical blood loss or risk of bleeding: yes

## 2020-10-26 NOTE — ED Notes (Signed)
Archie Patten, RN from short stay called for report- Awaiting Covid test

## 2020-10-26 NOTE — ED Notes (Signed)
Returns from CT 

## 2020-10-26 NOTE — ED Notes (Signed)
Patient is in CT.

## 2020-10-26 NOTE — Anesthesia Preprocedure Evaluation (Addendum)
Anesthesia Evaluation  Patient identified by MRN, date of birth, ID band Patient awake    Reviewed: Allergy & Precautions, NPO status , Patient's Chart, lab work & pertinent test results, reviewed documented beta blocker date and time   Airway Mallampati: III  TM Distance: >3 FB Neck ROM: Full    Dental no notable dental hx. (+) Teeth Intact, Dental Advisory Given   Pulmonary neg pulmonary ROS,    Pulmonary exam normal breath sounds clear to auscultation       Cardiovascular hypertension, Pt. on medications and Pt. on home beta blockers Normal cardiovascular exam Rhythm:Regular Rate:Normal     Neuro/Psych negative neurological ROS  negative psych ROS   GI/Hepatic negative GI ROS, Neg liver ROS,   Endo/Other  Obesity BMI 34  Renal/GU Renal disease (L ureteral stone)  negative genitourinary   Musculoskeletal negative musculoskeletal ROS (+)   Abdominal (+) + obese,   Peds  Hematology negative hematology ROS (+)   Anesthesia Other Findings   Reproductive/Obstetrics PCOS                           Anesthesia Physical Anesthesia Plan  ASA: II  Anesthesia Plan: General   Post-op Pain Management:    Induction: Intravenous and Rapid sequence  PONV Risk Score and Plan: 4 or greater and Ondansetron, Dexamethasone, Midazolam and Treatment may vary due to age or medical condition  Airway Management Planned: Oral ETT  Additional Equipment: None  Intra-op Plan:   Post-operative Plan: Extubation in OR  Informed Consent: I have reviewed the patients History and Physical, chart, labs and discussed the procedure including the risks, benefits and alternatives for the proposed anesthesia with the patient or authorized representative who has indicated his/her understanding and acceptance.     Dental advisory given  Plan Discussed with: CRNA  Anesthesia Plan Comments: (Pt febrile/shaking in  preop, has been vomiting and nauseous all day- will proceed w/ RSI)       Anesthesia Quick Evaluation

## 2020-10-26 NOTE — H&P (Signed)
Margaret Rice is an 40 y.o. female.    Chief Complaint: Left Ureteral Stone with Refractory Colic  HPI:   1 - Left Ureteral Stone with Refractory Colic - 52mm left UPJ stone with mild hydro by ER CT 10/2020 on eval left flank pain. Some ipsilateral lower pole mild of calcium stone as well. UA without sig infectious parameters, Cr <1.5. No fevers. WBC <14k. No sig perinephric stranding. Nausea and pain very difficult to control in ER. On exam she has extreme malaise and shaking chills.   Today "Margaret Rice" is seen for further management of left ureteral stone with refractory colic. Last meal 8p yesterday. C19 screen negative. She endorses severe malaise and subjective chills.     Past Medical History:  Diagnosis Date  . Hypertension   . Kidney stones   . PCOS (polycystic ovarian syndrome)   . Vitamin D deficiency     Past Surgical History:  Procedure Laterality Date  . MOLE REMOVAL     BACK    Family History  Problem Relation Age of Onset  . Hypertension Mother   . Hypertension Father   . Diverticulitis Father         PARTIAL COLON REMOVED  . Heart disease Maternal Grandmother   . Heart disease Paternal Grandmother    Social History:  reports that she has never smoked. She has never used smokeless tobacco. She reports that she does not drink alcohol and does not use drugs.  Allergies: No Known Allergies  (Not in a hospital admission)   Results for orders placed or performed during the hospital encounter of 10/26/20 (from the past 48 hour(s))  CBC with Differential/Platelet     Status: Abnormal   Collection Time: 10/26/20  6:32 AM  Result Value Ref Range   WBC 13.6 (H) 4.0 - 10.5 K/uL   RBC 4.66 3.87 - 5.11 MIL/uL   Hemoglobin 14.8 12.0 - 15.0 g/dL   HCT 38.1 01.7 - 51.0 %   MCV 95.7 80.0 - 100.0 fL   MCH 31.8 26.0 - 34.0 pg   MCHC 33.2 30.0 - 36.0 g/dL   RDW 25.8 52.7 - 78.2 %   Platelets 327 150 - 400 K/uL   nRBC 0.0 0.0 - 0.2 %   Neutrophils Relative % 83 %   Neutro  Abs 11.4 (H) 1.7 - 7.7 K/uL   Lymphocytes Relative 12 %   Lymphs Abs 1.6 0.7 - 4.0 K/uL   Monocytes Relative 3 %   Monocytes Absolute 0.4 0.1 - 1.0 K/uL   Eosinophils Relative 0 %   Eosinophils Absolute 0.1 0.0 - 0.5 K/uL   Basophils Relative 1 %   Basophils Absolute 0.1 0.0 - 0.1 K/uL   Immature Granulocytes 1 %   Abs Immature Granulocytes 0.07 0.00 - 0.07 K/uL    Comment: Performed at [facility], [address] [place], [place] [address]  Comprehensive metabolic panel     Status: Abnormal   Collection Time: 10/26/20  6:32 AM  Result Value Ref Range   Sodium 138 135 - 145 mmol/L   Potassium 3.6 3.5 - 5.1 mmol/L   Chloride 109 98 - 111 mmol/L   CO2 21 (L) 22 - 32 mmol/L   Glucose, Bld 124 (H) 70 - 99 mg/dL    Comment: Glucose reference range applies only to samples taken after fasting for at least 8 hours.   BUN 18 6 - 20 mg/dL   Creatinine, Ser 6.14 0.44 - 1.00 mg/dL  Calcium 9.0 8.9 - 10.3 mg/dL   Total Protein 7.2 6.5 - 8.1 g/dL   Albumin 3.7 3.5 - 5.0 g/dL   AST 16 15 - 41 U/L   ALT 18 0 - 44 U/L   Alkaline Phosphatase 77 38 - 126 U/L   Total Bilirubin 0.8 0.3 - 1.2 mg/dL   GFR, Estimated >35 >46 mL/min    Comment: (NOTE) Calculated using the CKD-EPI Creatinine Equation (2021)    Anion gap 8 5 - 15    Comment: Performed at [facility], [address] [place], [place] [address]  I-Stat Beta hCG blood, ED ([facility], [facility], [facility] only)     Status: None   Collection Time: 10/26/20  6:57 AM  Result Value Ref Range   I-stat hCG, quantitative <5.0 <5 mIU/mL   Comment 3            Comment:   GEST. AGE      CONC.  (mIU/mL)   <=1 WEEK        5 - 50     2 WEEKS       50 - 500     3 WEEKS       100 - 10,000     4 WEEKS     1,000 - 30,000        FEMALE AND NON-PREGNANT FEMALE:     LESS THAN 5 mIU/mL   Urinalysis, Routine w reflex microscopic Urine, Clean Catch     Status: Abnormal   Collection Time: 10/26/20 11:00 AM  Result Value Ref Range    Color, Urine YELLOW YELLOW   APPearance HAZY (A) CLEAR   Specific Gravity, Urine 1.020 1.005 - 1.030   pH 6.0 5.0 - 8.0   Glucose, UA NEGATIVE NEGATIVE mg/dL   Hgb urine dipstick MODERATE (A) NEGATIVE   Bilirubin Urine NEGATIVE NEGATIVE   Ketones, ur 20 (A) NEGATIVE mg/dL   Protein, ur NEGATIVE NEGATIVE mg/dL   Nitrite NEGATIVE NEGATIVE   Leukocytes,Ua MODERATE (A) NEGATIVE   RBC / HPF 11-20 0 - 5 RBC/hpf   WBC, UA >50 (H) 0 - 5 WBC/hpf   Bacteria, UA RARE (A) NONE SEEN   Mucus PRESENT    Crystals PRESENT (A) NEGATIVE    Comment: Performed at [facility], [address] [place], [place] [address]  Lactic acid, plasma     Status: None   Collection Time: 10/26/20  1:14 PM  Result Value Ref Range   Lactic Acid, Venous 1.9 0.5 - 1.9 mmol/L    Comment: Performed at [facility], [address] [place], [place] [address]  Lactic acid, plasma     Status: None   Collection Time: 10/26/20  3:11 PM  Result Value Ref Range   Lactic Acid, Venous 1.0 0.5 - 1.9 mmol/L    Comment: Performed at [facility], [address], [place], [place] [address]   CT Renal Stone Study  Result Date: 10/26/2020 CLINICAL DATA:  Left flank pain EXAM: CT ABDOMEN AND PELVIS WITHOUT CONTRAST TECHNIQUE: Multidetector CT imaging of the abdomen and pelvis was performed following the standard protocol without oral or IV contrast. COMPARISON:  July 21, 2013 FINDINGS: Lower chest: Lung bases are clear.  There is a focal hiatal hernia. Hepatobiliary: No focal liver lesions are evident on this noncontrast enhanced study. Gallbladder wall is not appreciably thickened. There is no biliary duct dilatation. Pancreas: There is no pancreatic mass or inflammatory focus. Spleen: No  splenic lesions are evident. Adrenals/Urinary Tract: Adrenals bilaterally appear normal. Left kidney is subtly edematous. There is no evident renal mass on either side. There is mild  hydronephrosis on the left. There is no appreciable hydronephrosis on the right. There is a 4 x 2 mm calculus in the lower pole of the left kidney. There is a calculus at the left ureteropelvic junction measuring 8 x 4 mm. No other ureteral calculi are evident. Urinary bladder is midline with wall thickness within normal limits. Stomach/Bowel: There is no appreciable bowel wall or mesenteric thickening. No evident bowel obstruction. Terminal ileum appears normal. Appendix appears normal. No free air or portal venous air. Vascular/Lymphatic: No abdominal aortic aneurysm. No appreciable vascular lesions evident on this noncontrast enhanced study. There is no evident adenopathy in the abdomen or pelvis. Reproductive: Uterus is retroverted. There is a cystic mass arising from the right ovary measuring 4.3 x 3.5 cm. No other pelvic mass evident. Other: No abscess or ascites evident in the abdomen or pelvis. Musculoskeletal: No blastic or lytic bone lesions. No intramuscular or abdominal wall lesions. IMPRESSION: 1. 8 x 4 mm calculus at the left ureteropelvic junction causing mild hydronephrosis and subtle left renal edema. There is also a 4 x 2 mm nonobstructing calculus in the lower pole left kidney. 2.  Fairly small focal hiatal hernia. 3. No bowel wall thickening or bowel obstruction. No abscess in the abdomen or pelvis. Appendix appears normal. 4. Cystic right adnexal mass measuring 4.3 x 3.5 cm. Simple cyst most likely etiology. No follow-up imaging recommended unless clinical symptoms related to this area present. Note: This recommendation does not apply to premenarchal patients and to those with increased risk (genetic, family history, elevated tumor markers or other high-risk factors) of ovarian cancer. Reference: JACR 2020 Feb; 17(2):248-254 Electronically Signed   By: Bretta Bang III M.D.   On: 10/26/2020 08:40    Review of Systems  Constitutional: Positive for chills and fatigue.  HENT: Negative.    Eyes: Negative.   Respiratory: Negative.   Cardiovascular: Negative.   Gastrointestinal: Negative.   Genitourinary: Positive for flank pain.  All other systems reviewed and are negative.   Blood pressure 114/77, pulse 95, temperature 97.8 F (36.6 C), temperature source Oral, resp. rate (!) 22, height 5\' 4"  (1.626 m), weight 90.7 kg, SpO2 98 %. Physical Exam Vitals reviewed.  HENT:     Head: Normocephalic.  Cardiovascular:     Rate and Rhythm: Normal rate.  Pulmonary:     Effort: Pulmonary effort is normal.  Abdominal:     General: Abdomen is flat.     Palpations: Abdomen is soft.  Genitourinary:    Comments: Moderate Lt CVAT at present.  Skin:    General: Skin is warm.  Neurological:     General: No focal deficit present.     Mental Status: She is alert.      Assessment/Plan  1 - Left Ureteral Stone with Refractory Colic - Rec urgent renal decompression with goal of improve colic and max rule out infectious parameters followed by ureteroscopy in elective setting as optimal path. Though her infectious parameters are equivocal, her exam is quite concerning for impending obstructing pyelo and decompression alone would be safest in acute setting.   Risks, benefits, alternatives, expected peri-op course discussed in detail.   , MD 10/26/2020, 4:14 PM

## 2020-10-26 NOTE — ED Provider Notes (Signed)
[facility]-EMERGENCY DEPT Provider Note   CSN: [id number] Arrival date & time: 10/26/20  0102     History Chief Complaint  Patient presents with  . Abdominal Pain  . Emesis    Margaret Rice is a 40 y.o. female.  HPI Patient reports she went to bed feeling well.  She had a normal day yesterday.  She awakened suddenly from sleep around 3 AM with a severe pain in her left flank.  She reports it radiates slightly to her back.  No radiation into the extremities or chest.  Patient reports shortly after it started she became extremely nauseated and started vomiting multiple times.  No diarrhea or constipation.  No pain burning urgency with urination.  Patient has a history of kidney stones but cannot recall if this is similar to when she had a stone is been so long.    Past Medical History:  Diagnosis Date  . Hypertension   . Kidney stones   . PCOS (polycystic ovarian syndrome)   . Vitamin D deficiency     Patient Active Problem List   Diagnosis Date Noted  . Essential hypertension 11/12/2017  . PCOS (polycystic ovarian syndrome) 11/12/2017  . Class 1 obesity due to excess calories without serious comorbidity with body mass index (BMI) of 34.0 to 34.9 in adult 07/31/2016  . Hirsutism 07/31/2016    Past Surgical History:  Procedure Laterality Date  . MOLE REMOVAL     BACK     OB History   No obstetric history on file.     Family History  Problem Relation Age of Onset  . Hypertension Mother   . Hypertension Father   . Diverticulitis Father         PARTIAL COLON REMOVED  . Heart disease Maternal Grandmother   . Heart disease Paternal Grandmother     Social History   Tobacco Use  . Smoking status: Never Smoker  . Smokeless tobacco: Never Used  Vaping Use  . Vaping Use: Never used  Substance Use Topics  . Alcohol use: No  . Drug use: No    Home Medications Prior to Admission medications   Medication Sig Start Date End Date Taking?  Authorizing Provider  acetaminophen (TYLENOL) 325 MG tablet Take 650 mg by mouth every 6 (six) hours as needed (pain).   Yes [provider]  Cholecalciferol (VITAMIN D) 2000 units CAPS Take 2,000 Units by mouth daily.   Yes [provider]  Ferrous Sulfate (IRON SUPPLEMENT PO) Take 45 mg by mouth daily as needed (iron deficiency during monthly cycle).   Yes [provider]  metoprolol succinate (TOPROL-XL) 25 MG 24 hr tablet TAKE 1 TABLET BY MOUTH EVERYDAY AT BEDTIME Patient taking differently: Take 25 mg by mouth daily. 09/08/20  Yes Just, Azalee Course, FNP  Multiple Vitamins-Minerals (HAIR SKIN AND NAILS FORMULA PO) Take 1 tablet by mouth in the morning, at noon, and at bedtime.   Yes [provider]  norethindrone-ethinyl estradiol (BALZIVA) 0.4-35 MG-MCG tablet Take 1 tablet by mouth daily.   Yes [provider]    Allergies    Patient has no known allergies.  Review of Systems   Review of Systems 10 systems reviewed negative except as per HPI Physical Exam Updated Vital Signs BP 119/73   Pulse 97   Temp 97.8 F (36.6 C) (Oral)   Resp (!) 21   Ht 5\' 4"  (1.626 m)   Wt 90.7 kg   SpO2 98%  BMI 34.33 kg/m   Physical Exam Constitutional:      Comments: Patient is very uncomfortable in appearance.  Lying on left side.  No respiratory distress.  HENT:     Mouth/Throat:     Pharynx: Oropharynx is clear.  Eyes:     Extraocular Movements: Extraocular movements intact.  Cardiovascular:     Rate and Rhythm: Normal rate and regular rhythm.  Pulmonary:     Effort: Pulmonary effort is normal.     Breath sounds: Normal breath sounds.  Abdominal:     Comments: Abdomen is soft.  Moderate left lateral quadrant tenderness to palpation no guarding.  Lower abdomen nontender.  Musculoskeletal:        General: No swelling or tenderness. Normal range of motion.     Right lower leg: No edema.     Left lower leg: No edema.  Skin:    General: Skin is  warm and dry.  Neurological:     General: No focal deficit present.     Mental Status: She is oriented to person, place, and time.     Motor: No weakness.     ED Results / Procedures / Treatments   Labs (all labs ordered are listed, but only abnormal results are displayed) Labs Reviewed  CBC WITH DIFFERENTIAL/PLATELET - Abnormal; Notable for the following components:      Result Value   WBC 13.6 (*)    Neutro Abs 11.4 (*)    All other components within normal limits  COMPREHENSIVE METABOLIC PANEL - Abnormal; Notable for the following components:   CO2 21 (*)    Glucose, Bld 124 (*)    All other components within normal limits  URINALYSIS, ROUTINE W REFLEX MICROSCOPIC - Abnormal; Notable for the following components:   APPearance HAZY (*)    Hgb urine dipstick MODERATE (*)    Ketones, ur 20 (*)    Leukocytes,Ua MODERATE (*)    WBC, UA >50 (*)    Bacteria, UA RARE (*)    Crystals PRESENT (*)    All other components within normal limits  URINE CULTURE  CULTURE, BLOOD (ROUTINE X 2)  CULTURE, BLOOD (ROUTINE X 2)  RESP PANEL BY RT-PCR (FLU A&B, COVID) ARPGX2  LACTIC ACID, PLASMA  LACTIC ACID, PLASMA  I-STAT BETA HCG BLOOD, ED ([facility], [facility], [facility] ONLY)    EKG None  Radiology CT Renal Stone Study  Result Date: 10/26/2020 CLINICAL DATA:  Left flank pain EXAM: CT ABDOMEN AND PELVIS WITHOUT CONTRAST TECHNIQUE: Multidetector CT imaging of the abdomen and pelvis was performed following the standard protocol without oral or IV contrast. COMPARISON:  July 21, 2013 FINDINGS: Lower chest: Lung bases are clear.  There is a focal hiatal hernia. Hepatobiliary: No focal liver lesions are evident on this noncontrast enhanced study. Gallbladder wall is not appreciably thickened. There is no biliary duct dilatation. Pancreas: There is no pancreatic mass or inflammatory focus. Spleen: No splenic lesions are evident. Adrenals/Urinary Tract: Adrenals bilaterally appear normal. Left kidney is subtly  edematous. There is no evident renal mass on either side. There is mild hydronephrosis on the left. There is no appreciable hydronephrosis on the right. There is a 4 x 2 mm calculus in the lower pole of the left kidney. There is a calculus at the left ureteropelvic junction measuring 8 x 4 mm. No other ureteral calculi are evident. Urinary bladder is midline with wall thickness within normal limits. Stomach/Bowel: There is no appreciable bowel wall or mesenteric thickening. No evident  bowel obstruction. Terminal ileum appears normal. Appendix appears normal. No free air or portal venous air. Vascular/Lymphatic: No abdominal aortic aneurysm. No appreciable vascular lesions evident on this noncontrast enhanced study. There is no evident adenopathy in the abdomen or pelvis. Reproductive: Uterus is retroverted. There is a cystic mass arising from the right ovary measuring 4.3 x 3.5 cm. No other pelvic mass evident. Other: No abscess or ascites evident in the abdomen or pelvis. Musculoskeletal: No blastic or lytic bone lesions. No intramuscular or abdominal wall lesions. IMPRESSION: 1. 8 x 4 mm calculus at the left ureteropelvic junction causing mild hydronephrosis and subtle left renal edema. There is also a 4 x 2 mm nonobstructing calculus in the lower pole left kidney. 2.  Fairly small focal hiatal hernia. 3. No bowel wall thickening or bowel obstruction. No abscess in the abdomen or pelvis. Appendix appears normal. 4. Cystic right adnexal mass measuring 4.3 x 3.5 cm. Simple cyst most likely etiology. No follow-up imaging recommended unless clinical symptoms related to this area present. Note: This recommendation does not apply to premenarchal patients and to those with increased risk (genetic, family history, elevated tumor markers or other high-risk factors) of ovarian cancer. Reference: JACR 2020 Feb; 17(2):248-254 Electronically Signed   By: Bretta Bang III M.D.   On: 10/26/2020 08:40     Procedures Procedures   Medications Ordered in ED Medications  lactated ringers infusion ( Intravenous New Bag/Given 10/26/20 1524)  HYDROmorphone (DILAUDID) injection 1 mg (has no administration in time range)  HYDROmorphone (DILAUDID) injection 1 mg (1 mg Intravenous Given 10/26/20 0736)  ondansetron (ZOFRAN) injection 4 mg (4 mg Intravenous Given 10/26/20 0736)  lactated ringers bolus 1,000 mL (0 mLs Intravenous Stopped 10/26/20 1105)  ketorolac (TORADOL) 30 MG/ML injection 30 mg (30 mg Intravenous Given 10/26/20 0923)  HYDROmorphone (DILAUDID) injection 0.5 mg (0.5 mg Intravenous Given 10/26/20 0923)  tamsulosin (FLOMAX) capsule 0.4 mg (0.4 mg Oral Given 10/26/20 0948)  cefTRIAXone (ROCEPHIN) 2 g in sodium chloride 0.9 % 100 mL IVPB (0 g Intravenous Stopped 10/26/20 1448)  HYDROmorphone (DILAUDID) injection 1 mg (1 mg Intravenous Given 10/26/20 1427)  pantoprazole (PROTONIX) injection 40 mg (40 mg Intravenous Given 10/26/20 1427)  ondansetron (ZOFRAN) injection 4 mg (4 mg Intravenous Given 10/26/20 1427)  ondansetron (ZOFRAN) injection 4 mg (4 mg Intravenous Given 10/26/20 1522)    ED Course  I have reviewed the triage vital signs and the nursing notes.  Pertinent labs & imaging results that were available during my care of the patient were reviewed by me and considered in my medical decision making (see chart for details).  Clinical Course as of 10/26/20 1555  Tue Oct 26, 2020  3151 Recheck: Pain is have not yet been administered.  Continues to feel very nauseated and pain. [MP]  0915 Recheck: Pain is improved.  Patient is not having any ongoing vomiting.  She reports she still somewhat uncomfortable.  Will add Toradol and another half milligram Dilaudid.  Will consult urology for very large stone [MP]  0932 Consult: Reviewed with Dr. Berneice Heinrich urology.  Lyses if patient can be adequately pain controlled appropriate to follow-up outpatient with urology in the office. [MP]  1206 UA has  returned with large amount of leukocytes.  Will initiate Rocephin, obtain a lactic acid and blood culture and reconsult urology. [MP]    Clinical Course User Index [MP] Arby Barrette, MD   MDM Rules/Calculators/A&P  Left kidney stone identified with hydronephrosis.  Stone is up to 8 mm and 1 diameter.  Patient does also have positive urinalysis for greater than 50 WBCs.  Patient was treated with hydration and Rocephin and pain control.  Patient did continue to have rebound of nausea and pain.  After several hours of managing symptoms, it appears unlikely she will be successful managing symptoms at home.  Reviewed the case and the rebounding pain and nausea with Dr. Berneice Heinrich.  He will plan to place a stent in the evening estimating around 6 PM.  Patient has been maintained n.p.o.  Orders written for as needed pain and nausea control and ongoing fluids. Final Clinical Impression(s) / ED Diagnoses Final diagnoses:  Lower urinary tract infectious disease  Kidney stone  Intractable pain    Rx / DC Orders ED Discharge Orders    None       Arby Barrette, MD 10/26/20 1557

## 2020-10-26 NOTE — Transfer of Care (Signed)
Immediate Anesthesia Transfer of Care Note  Patient: Margaret Rice  Procedure(s) Performed: Procedure(s): CYSTOSCOPY/URETEROSCOPY/HOLMIUM LASER/STENT PLACEMENT (Left)  Patient Location: PACU  Anesthesia Type:General  Level of Consciousness: Alert, Awake, Oriented  Airway & Oxygen Therapy: Patient Spontanous Breathing  Post-op Assessment: Report given to RN  Post vital signs: Reviewed and stable  Last Vitals:  Vitals:   10/26/20 1651 10/26/20 1717  BP: 117/86 (!) 144/92  Pulse: (!) 104 (!) 113  Resp: 20 20  Temp: 36.8 C 37.1 C  SpO2: 98% 100%    Complications: No apparent anesthesia complications

## 2020-10-26 NOTE — ED Notes (Signed)
Medicated for pain- Warm blankets for comfort.  Patient is awaiting CT scan

## 2020-10-26 NOTE — ED Triage Notes (Addendum)
Patient is alert and oriented x4 ambulatory. Patient was woke up out of sleep with pain in her Left lower quadrant of abdomin. Patient started sweating and vomiting a hour later and is still currently vomiting. Pain is a 8 out of 10. Boyfriend is at bedside.

## 2020-10-26 NOTE — Anesthesia Postprocedure Evaluation (Signed)
Anesthesia Post Note  Patient: Margaret Rice  Procedure(s) Performed: CYSTOSCOPY/URETEROSCOPY/HOLMIUM LASER/STENT PLACEMENT (Left )     Patient location during evaluation: PACU Anesthesia Type: General Level of consciousness: awake and alert, oriented and patient cooperative Pain management: pain level controlled Vital Signs Assessment: post-procedure vital signs reviewed and stable Respiratory status: spontaneous breathing, nonlabored ventilation and respiratory function stable Cardiovascular status: blood pressure returned to baseline and stable Postop Assessment: no apparent nausea or vomiting Anesthetic complications: no   No complications documented.  Last Vitals:  Vitals:   10/26/20 1813 10/26/20 1815  BP: 123/75 113/77  Pulse: (!) 113 (!) 107  Resp: 15 17  Temp: (!) 38.1 C   SpO2: 100% 100%    Last Pain:  Vitals:   10/26/20 1813  TempSrc:   PainSc: 0-No pain                 Lannie Fields

## 2020-10-26 NOTE — Anesthesia Procedure Notes (Signed)
Procedure Name: Intubation Date/Time: 10/26/2020 5:44 PM Performed by: Gerald Leitz, CRNA Pre-anesthesia Checklist: Patient identified, Patient being monitored, Timeout performed, Emergency Drugs available and Suction available Patient Re-evaluated:Patient Re-evaluated prior to induction Oxygen Delivery Method: Circle system utilized Preoxygenation: Pre-oxygenation with 100% oxygen Induction Type: IV induction Ventilation: Mask ventilation without difficulty Laryngoscope Size: Mac and 3 Grade View: Grade I Tube type: Oral Tube size: 7.0 mm Number of attempts: 1 Placement Confirmation: ETT inserted through vocal cords under direct vision,  positive ETCO2 and breath sounds checked- equal and bilateral Secured at: 21 cm Tube secured with: Tape Dental Injury: Teeth and Oropharynx as per pre-operative assessment

## 2020-10-27 ENCOUNTER — Encounter (HOSPITAL_COMMUNITY): Payer: Self-pay | Admitting: Urology

## 2020-10-27 LAB — BASIC METABOLIC PANEL
Anion gap: 8 (ref 5–15)
BUN: 13 mg/dL (ref 6–20)
CO2: 21 mmol/L — ABNORMAL LOW (ref 22–32)
Calcium: 8.1 mg/dL — ABNORMAL LOW (ref 8.9–10.3)
Chloride: 108 mmol/L (ref 98–111)
Creatinine, Ser: 0.89 mg/dL (ref 0.44–1.00)
GFR, Estimated: >60 mL/min (ref >60)
Glucose, Bld: 151 mg/dL — ABNORMAL HIGH (ref 70–99)
Potassium: 3.9 mmol/L (ref 3.5–5.1)
Sodium: 137 mmol/L (ref 135–145)

## 2020-10-27 LAB — CBC
HCT: 36.9 % (ref 36.0–46.0)
Hemoglobin: 12 g/dL (ref 12.0–15.0)
MCH: 31.5 pg (ref 26.0–34.0)
MCHC: 32.5 g/dL (ref 30.0–36.0)
MCV: 96.9 fL (ref 80.0–100.0)
Platelets: 251 10*3/uL (ref 150–400)
RBC: 3.81 MIL/uL — ABNORMAL LOW (ref 3.87–5.11)
RDW: 12.4 % (ref 11.5–15.5)
WBC: 17.4 10*3/uL — ABNORMAL HIGH (ref 4.0–10.5)
nRBC: 0 % (ref 0.0–0.2)

## 2020-10-27 MED ORDER — TRAMADOL HCL 50 MG PO TABS: 50.0000 mg | ORAL_TABLET | Freq: Four times a day (QID) | ORAL | 0 refills | Status: DC | PRN

## 2020-10-27 MED ORDER — CEPHALEXIN 500 MG PO CAPS: ORAL_CAPSULE | ORAL | 0 refills | Status: DC

## 2020-10-27 MED ORDER — SODIUM CHLORIDE 0.9 % IV BOLUS
500.0000 mL | Freq: Once | INTRAVENOUS | Status: AC
  Administered 2020-10-27: 500 mL via INTRAVENOUS

## 2020-10-27 MED ORDER — KETOROLAC TROMETHAMINE 10 MG PO TABS: 10.0000 mg | ORAL_TABLET | Freq: Three times a day (TID) | ORAL | 0 refills | Status: DC | PRN

## 2020-10-27 NOTE — Discharge Instructions (Signed)
1 - You may have urinary urgency (bladder spasms) and bloody urine on / off with stent in place. This is normal. ° °2 - Call MD or go to ER for fever >102, severe pain / nausea / vomiting not relieved by medications, or acute change in medical status ° °

## 2020-10-27 NOTE — Discharge Summary (Addendum)
Physician Discharge Summary  Patient ID: Margaret Rice MRN: [id number] DOB/AGE: 03-23-1981 40 y.o.  Admit date: 10/26/2020   Discharge date: 10/30/2020  Admission Diagnoses: Left Ureteral Stone and Urinary Infection  Discharge Diagnoses:  Active Problems:   Ureteral stone with hydronephrosis   Discharged Condition: good  Hospital Course: Pt underwent urgent left ureteral stent placement on 10/26/20, the day of admission, without acute complication for renal decompression from proximal ureteral stone in setting of suspect early pyelonephritis. She was continued on IV ceftriaxone until POD 4 when she has been without high grade fevers x 24 hours, is ambulatory, tollerating PO intake, and felt to be adequate for discharge. Final blood CX negative and urine CX non-clonal  Consults: None  Significant Diagnostic Studies: labs: as per above.   Treatments: surgery: as per above  Discharge Exam: Blood pressure 112/77, pulse 73, temperature 98.6 F (37 C), temperature source Oral, resp. rate 16, height 5\' 4"  (1.626 m), weight 90.7 kg, SpO2 95 %. General appearance: alert, cooperative and family at bedside Eyes: negative Nose: Nares normal. Septum midline. Mucosa normal. No drainage or sinus tenderness. Throat: lips, mucosa, and tongue normal; teeth and gums normal Neck: supple, symmetrical, trachea midline Back: symmetric, no curvature. ROM normal. No CVA tenderness. Chest wall: no tenderness Cardio: Nl rate GI: soft, non-tender; bowel sounds normal; no masses,  no organomegaly Extremities: extremities normal, atraumatic, no cyanosis or edema Lymph nodes: Cervical, supraclavicular, and axillary nodes normal. Neurologic: Grossly normal  Disposition: HOME   Allergies as of 10/27/2020   No Known Allergies     Medication List    TAKE these medications   acetaminophen 325 MG tablet Commonly known as: TYLENOL Take 650 mg by mouth every 6 (six) hours as needed (pain).   Balziva  0.4-35 MG-MCG tablet Generic drug: norethindrone-ethinyl estradiol Take 1 tablet by mouth daily.   cephALEXin 500 MG capsule Commonly known as: KEFLEX One tab by mouth twice daily X 7 days now. Also begin 2 days before next Urology surgery.   HAIR SKIN AND NAILS FORMULA PO Take 1 tablet by mouth in the morning, at noon, and at bedtime.   IRON SUPPLEMENT PO Take 45 mg by mouth daily as needed (iron deficiency during monthly cycle).   ketorolac 10 MG tablet Commonly known as: TORADOL Take 1 tablet (10 mg total) by mouth every 8 (eight) hours as needed for moderate pain. Or stent discomfort post-operatively.   metoprolol succinate 25 MG 24 hr tablet Commonly known as: TOPROL-XL TAKE 1 TABLET BY MOUTH EVERYDAY AT BEDTIME What changed:   how much to take  how to take this  when to take this  additional instructions   traMADol 50 MG tablet Commonly known as: Ultram Take 1-2 tablets (50-100 mg total) by mouth every 6 (six) hours as needed for severe pain. Post-operatively   Vitamin D 50 MCG (2000 UT) Caps Take 2,000 Units by mouth daily.        Signed: 12-22-1991 10/27/2020, 12:32 PM

## 2020-10-28 LAB — URINE CULTURE

## 2020-10-28 MED ORDER — ACETAMINOPHEN 325 MG PO TABS
650.0000 mg | ORAL_TABLET | Freq: Four times a day (QID) | ORAL | Status: DC | PRN
  Administered 2020-10-28 (×2): 650 mg via ORAL
  Filled 2020-10-28 (×3): qty 2

## 2020-10-28 NOTE — Progress Notes (Signed)
2 Days Post-Op   Subjective/Chief Complaint:  1 - Left Ureteral Stone with Refractory Colic - 75mm left ureteral stone by ER CT on admission. S/p Left ureteral stent placement on 4/26 as temporizing measure.   2 - Obstructing Pyelonephritis - fevers + malaise peri-op left stent 4/26 c/w pyelo. BCX 4/26 NGTD. UCX 4/26 non-clonal. Placed on empiric rocephin.  Today "Margaret Rice" is feeling subejctively improved. Did have subsantial fever spike x several yesterday, therefore decision to keep in house on IV ABX.    Objective: Vital signs in last 24 hours: Temp:  [98 F (36.7 C)-103 F (39.4 C)] 98.7 F (37.1 C) (04/28 0732) Pulse Rate:  [88-122] 88 (04/28 0502) Resp:  [16-20] 18 (04/28 0502) BP: (103-120)/(59-83) 103/65 (04/28 0502) SpO2:  [96 %-99 %] 97 % (04/28 0502) Last BM Date: 10/25/20  Intake/Output from previous day: 04/27 0701 - 04/28 0700 In: 4627.6 [P.O.:1320; I.V.:3260.2; IV Piggyback:47.4] Out: 2750 [Urine:2750] Intake/Output this shift: Total I/O In: 818.4 [P.O.:360; I.V.:208.4; IV Piggyback:250] Out: 750 [Urine:750]  General appearance: alert, cooperative and extremely pleasant Eyes: negative Nose: Nares normal. Septum midline. Mucosa normal. No drainage or sinus tenderness. Throat: lips, mucosa, and tongue normal; teeth and gums normal Neck: supple, symmetrical, trachea midline Back: symmetric, no curvature. ROM normal. No CVA tenderness. Resp: non- labored on room air.  Cardio: sinus with normal rate at present.  GI: soft, non-tender; bowel sounds normal; no masses,  no organomegaly Extremities: extremities normal, atraumatic, no cyanosis or edema Lymph nodes: Cervical, supraclavicular, and axillary nodes normal. Neurologic: Grossly normal  Lab Results:  Recent Labs    10/26/20 0632 10/27/20 0442  WBC 13.6* 17.4*  HGB 14.8 12.0  HCT 44.6 36.9  PLT 327 251   BMET Recent Labs    10/26/20 0632 10/27/20 0442  NA 138 137  K 3.6 3.9  CL 109 108  CO2 21*  21*  GLUCOSE 124* 151*  BUN 18 13  CREATININE 0.95 0.89  CALCIUM 9.0 8.1*   PT/INR No results for input(s): LABPROT, INR in the last 72 hours. ABG No results for input(s): PHART, HCO3 in the last 72 hours.  Invalid input(s): PCO2, PO2  Studies/Results: DG C-Arm 1-60 Min-No Report  Result Date: 10/26/2020 Fluoroscopy was utilized by the requesting physician.  No radiographic interpretation.    Anti-infectives: Anti-infectives (From admission, onward)   Start     Dose/Rate Route Frequency Ordered Stop   10/27/20 1400  cefTRIAXone (ROCEPHIN) 2 g in sodium chloride 0.9 % 100 mL IVPB        2 g 200 mL/hr over 30 Minutes Intravenous Every 24 hours 10/26/20 1944     10/27/20 0000  cephALEXin (KEFLEX) 500 MG capsule           10/27/20 1232     10/26/20 1215  cefTRIAXone (ROCEPHIN) 2 g in sodium chloride 0.9 % 100 mL IVPB        2 g 200 mL/hr over 30 Minutes Intravenous  Once 10/26/20 1206 10/26/20 1448      Assessment/Plan: Rec remain in house on IV ABX until fever free x 24 hours. If fever spikes do not trend down, we sill consider double coverage with aminoglycoside.   She voiced understanding of goals for DC and plan.  Will plan for ureteroscopy in elective setting few weeks from now.   [name] 10/28/2020

## 2020-10-28 NOTE — Op Note (Signed)
Margaret Rice, KALISZ MEDICAL RECORD NO: [id number] ACCOUNT NO: [id number] DATE OF BIRTH: 14-Aug-1980 FACILITY: [facility] LOCATION: [id number] PHYSICIAN: [name], MD  Operative Report   DATE OF PROCEDURE: 10/26/2020  POSTOPERATIVE DIAGNOSIS:  Left ureteral stone with refractory colic, concern for impending urosepsis.  PROCEDURE PERFORMED: 1.  Cystoscopy with left retrograde pyelogram, interpretation. 2.  Left ureteral stent placement, 5 x 24 Polaris, no tether.  ESTIMATED BLOOD LOSS:  Nil.  COMPLICATIONS:  None.  SPECIMENS:  None.  FINDINGS:  1.  Mild left hydronephrosis from proximal ureteral stone. 2.  Successful placement of left ureteral stent, proximal end in renal pelvis, distal end in urinary bladder.  INDICATIONS FOR PROCEDURE:  The patient is a pleasant 40 year old woman who was found on workup of colicky flank pain of left proximal ureteral stone.  Her objective infectious parameters were initially favorable. She was managed in the ER conservatively  for several hours; however, her colic remained quite difficult to control and she began to develop more malaise and rigors.  The overall picture was quite concerning for possible impending urosepsis from obstructing stone, so the most prudent means of  management would be staged management with a left ureteral stent today for renal decompression, allow her to clear infectious parameters and then ureteroscopy with the goal of stone free in the elective setting and she wished to proceed with stenting  today.  Informed consent was obtained and placed in the medical record.  PROCEDURE IN DETAIL:  The patient being verified, procedure being left ureteral stent placement confirmed.  Procedure timeout was performed.  Intravenous antibiotics administered. General LMA anesthesia induced.  The patient was placed into a low  lithotomy position.  Sterile field was created, prepped and draped the patient's vagina, introitus and proximal thighs  using iodine.  Cystourethroscopy was performed using 21-French rigid scope with offset lens.  Inspection of bladder revealed no  diverticula, calcifications or papillary lesions.  The left ureteral orifice was cannulated with a 6-French end-hole catheter and left retrograde pyelogram was obtained.  Left retrograde pyelogram demonstrated a single left ureter with a single system left kidney.  There was a filling defect in the proximal ureter consistent with known stone.  There was a mild hydronephrosis above this.  A 0.038 sensor wire was advanced  to the level of the upper mid pole, over which a new 5 x 24 Polaris type stent was placed using cystoscopic and fluoroscopic guidance.  Good proximal and distal plane were noted.  Procedure was terminated.  The patient tolerated well and no immediate  perioperative complications.  The patient was taken to postanesthesia care unit in stable condition.  PLAN:  For observation, admission, pending her clinical status.   SHW D: 10/26/2020 6:00:53 pm T: 10/27/2020 7:32:00 am  JOB: [id number]/ [id number]

## 2020-10-29 LAB — CBC
HCT: 36.7 % (ref 36.0–46.0)
Hemoglobin: 12 g/dL (ref 12.0–15.0)
MCH: 31.7 pg (ref 26.0–34.0)
MCHC: 32.7 g/dL (ref 30.0–36.0)
MCV: 96.8 fL (ref 80.0–100.0)
Platelets: 185 10*3/uL (ref 150–400)
RBC: 3.79 MIL/uL — ABNORMAL LOW (ref 3.87–5.11)
RDW: 12.7 % (ref 11.5–15.5)
WBC: 6.5 10*3/uL (ref 4.0–10.5)
nRBC: 0 % (ref 0.0–0.2)

## 2020-10-29 MED ORDER — ACETAMINOPHEN 500 MG PO TABS
1000.0000 mg | ORAL_TABLET | Freq: Three times a day (TID) | ORAL | Status: DC
  Administered 2020-10-29 – 2020-10-30 (×2): 1000 mg via ORAL
  Filled 2020-10-29 (×2): qty 2

## 2020-10-29 MED ORDER — PANTOPRAZOLE SODIUM 40 MG PO TBEC
40.0000 mg | DELAYED_RELEASE_TABLET | Freq: Every day | ORAL | Status: DC
  Administered 2020-10-29: 40 mg via ORAL
  Filled 2020-10-29: qty 1

## 2020-10-29 NOTE — Progress Notes (Signed)
3 Days Post-Op   Subjective/Chief Complaint:  1 - Left Ureteral Stone with Refractory Colic - 73mm left ureteral stone by ER CT on admission. S/p Left ureteral stent placement on 4/26 as temporizing measure.   2 - Obstructing Pyelonephritis - fevers + malaise peri-op left stent 4/26 c/w pyelo. WBC peak at 17k 4/27, down to [facility] 4/29. BCX 4/26 NGTD. UCX 4/26 non-clonal. Placed on empiric rocephin.  Today "Margaret Rice" continues to have occasional spells of malaise and fevers, but peaks trending down. C/o mild GERD symptoms.    Objective: Vital signs in last 24 hours: Temp:  [98.1 F (36.7 C)-99.9 F (37.7 C)] 99.4 F (37.4 C) (04/29 1414) Pulse Rate:  [86-108] 106 (04/29 1414) Resp:  [18] 18 (04/29 1414) BP: (111-133)/(74-86) 124/80 (04/29 1414) SpO2:  [98 %-99 %] 99 % (04/29 1326) Last BM Date: 10/26/20  Intake/Output from previous day: 04/28 0701 - 04/29 0700 In: 2825.7 [P.O.:1320; I.V.:1255.7; IV Piggyback:250] Out: 2450 [Urine:2450] Intake/Output this shift: Total I/O In: 480 [P.O.:480] Out: 7100 [Urine:7100]   General appearance: alert, cooperative and extremely pleasant. Family at bedside.  Eyes: negative Nose: Nares normal. Septum midline. Mucosa normal. No drainage or sinus tenderness. Throat: lips, mucosa, and tongue normal; teeth and gums normal Neck: supple, symmetrical, trachea midline Back: symmetric, no curvature. ROM normal. No CVA tenderness. Resp: non- labored on room air.  Cardio: sinus with normal rate at present.  GI: soft, non-tender; bowel sounds normal; no masses,  no organomegaly Extremities: extremities normal, atraumatic, no cyanosis or edema Lymph nodes: Cervical, supraclavicular, and axillary nodes normal. Neurologic: Grossly normal  Lab Results:  Recent Labs    10/27/20 0442 10/29/20 0501  WBC 17.4* 6.5  HGB 12.0 12.0  HCT 36.9 36.7  PLT 251 185   BMET Recent Labs    10/27/20 0442  NA 137  K 3.9  CL 108  CO2 21*  GLUCOSE 151*  BUN 13   CREATININE 0.89  CALCIUM 8.1*   PT/INR No results for input(s): LABPROT, INR in the last 72 hours. ABG No results for input(s): PHART, HCO3 in the last 72 hours.  Invalid input(s): PCO2, PO2  Studies/Results: No results found.  Anti-infectives: Anti-infectives (From admission, onward)   Start     Dose/Rate Route Frequency Ordered Stop   10/27/20 1400  cefTRIAXone (ROCEPHIN) 2 g in sodium chloride 0.9 % 100 mL IVPB        2 g 200 mL/hr over 30 Minutes Intravenous Every 24 hours 10/26/20 1944     10/27/20 0000  cephALEXin (KEFLEX) 500 MG capsule           10/27/20 1232     10/26/20 1215  cefTRIAXone (ROCEPHIN) 2 g in sodium chloride 0.9 % 100 mL IVPB        2 g 200 mL/hr over 30 Minutes Intravenous  Once 10/26/20 1206 10/26/20 1448      Assessment/Plan:  Improving, but not yet meeting criteria for discharge. Continue IVF, ceftriaxone. Will plan for DC when afebrile (no high grade fevers)  x 24 hours. Pantoprozole PO for GERD.   Margaret Rice 10/29/2020

## 2020-10-30 MED ORDER — OXYMETAZOLINE HCL 0.05 % NA SOLN
1.0000 | Freq: Two times a day (BID) | NASAL | Status: DC
  Administered 2020-10-30: 1 via NASAL
  Filled 2020-10-30: qty 15

## 2020-10-31 LAB — CULTURE, BLOOD (ROUTINE X 2)
Culture: NO GROWTH
Culture: NO GROWTH

## 2020-11-01 ENCOUNTER — Other Ambulatory Visit: Payer: Self-pay | Admitting: Urology

## 2020-11-05 ENCOUNTER — Encounter: Payer: BC Managed Care – PPO | Admitting: Family Medicine

## 2020-11-16 ENCOUNTER — Encounter (HOSPITAL_BASED_OUTPATIENT_CLINIC_OR_DEPARTMENT_OTHER): Payer: Self-pay | Admitting: Urology

## 2020-11-16 ENCOUNTER — Other Ambulatory Visit: Payer: Self-pay

## 2020-11-16 NOTE — Progress Notes (Signed)
Spoke w/ via phone for pre-op interview---pt Lab needs dos----   I stat, ekg, urine poct           Lab results------none COVID test -----patient states asymptomatic no test needed Arrive at -------645 am 11-19-2020 NPO after MN NO Solid Food.  Clear liquids from MN until---545 am then npo Med rec completed Medications to take morning of surgery -----none Diabetic medication -----n/a Patient instructed to bring photo id and insurance card day of surgery Patient aware to have Driver (ride ) / caregiver driver dave sign other will stay   for 24 hours after surgery  Patient Special Instructions -----none Pre-Op special Istructions -----none Patient verbalized understanding of instructions that were given at this phone interview. Patient denies shortness of breath, chest pain, fever, cough at this phone interview.

## 2020-11-19 ENCOUNTER — Other Ambulatory Visit: Payer: Self-pay

## 2020-11-19 ENCOUNTER — Ambulatory Visit (HOSPITAL_BASED_OUTPATIENT_CLINIC_OR_DEPARTMENT_OTHER): Payer: Managed Care, Other (non HMO) | Admitting: Anesthesiology

## 2020-11-19 ENCOUNTER — Ambulatory Visit (HOSPITAL_BASED_OUTPATIENT_CLINIC_OR_DEPARTMENT_OTHER)
Admission: RE | Admit: 2020-11-19 | Discharge: 2020-11-19 | Disposition: A | Discharge status: Home / Self Care | Payer: Managed Care, Other (non HMO) | Source: Ambulatory Visit | Attending: Urology | Admitting: Urology

## 2020-11-19 ENCOUNTER — Encounter (HOSPITAL_BASED_OUTPATIENT_CLINIC_OR_DEPARTMENT_OTHER)
Admission: RE | Disposition: A | Discharge status: Home / Self Care | Payer: Self-pay | Source: Ambulatory Visit | Attending: Urology

## 2020-11-19 ENCOUNTER — Encounter (HOSPITAL_BASED_OUTPATIENT_CLINIC_OR_DEPARTMENT_OTHER): Payer: Self-pay | Admitting: Urology

## 2020-11-19 DIAGNOSIS — N132 Hydronephrosis with renal and ureteral calculous obstruction: Secondary | ICD-10-CM | POA: Diagnosis not present

## 2020-11-19 DIAGNOSIS — N201 Calculus of ureter: Secondary | ICD-10-CM | POA: Diagnosis present

## 2020-11-19 DIAGNOSIS — Z87442 Personal history of urinary calculi: Secondary | ICD-10-CM | POA: Diagnosis not present

## 2020-11-19 HISTORY — PX: HOLMIUM LASER APPLICATION: SHX5852

## 2020-11-19 HISTORY — DX: Presence of spectacles and contact lenses: Z97.3

## 2020-11-19 HISTORY — DX: Personal history of urinary calculi: Z87.442

## 2020-11-19 HISTORY — PX: CYSTOSCOPY WITH RETROGRADE PYELOGRAM, URETEROSCOPY AND STENT PLACEMENT: SHX5789

## 2020-11-19 LAB — POCT I-STAT, CHEM 8
BUN: 14 mg/dL (ref 6–20)
Calcium, Ion: 1.3 mmol/L (ref 1.15–1.40)
Chloride: 111 mmol/L (ref 98–111)
Creatinine, Ser: 0.6 mg/dL (ref 0.44–1.00)
Glucose, Bld: 101 mg/dL — ABNORMAL HIGH (ref 70–99)
HCT: 47 % — ABNORMAL HIGH (ref 36.0–46.0)
Hemoglobin: 16 g/dL — ABNORMAL HIGH (ref 12.0–15.0)
Potassium: 3.8 mmol/L (ref 3.5–5.1)
Sodium: 144 mmol/L (ref 135–145)
TCO2: 22 mmol/L (ref 22–32)

## 2020-11-19 SURGERY — CYSTOURETEROSCOPY, WITH RETROGRADE PYELOGRAM AND STENT INSERTION
Anesthesia: General | Site: Ureter | Laterality: Left

## 2020-11-19 MED ORDER — ONDANSETRON HCL 4 MG/2ML IJ SOLN
INTRAMUSCULAR | Status: DC | PRN
  Administered 2020-11-19: 4 mg via INTRAVENOUS

## 2020-11-19 MED ORDER — SCOPOLAMINE 1 MG/3DAYS TD PT72
MEDICATED_PATCH | TRANSDERMAL | Status: AC
  Filled 2020-11-19: qty 1

## 2020-11-19 MED ORDER — DEXAMETHASONE SODIUM PHOSPHATE 10 MG/ML IJ SOLN
INTRAMUSCULAR | Status: AC
  Filled 2020-11-19: qty 1

## 2020-11-19 MED ORDER — FENTANYL CITRATE (PF) 100 MCG/2ML IJ SOLN
INTRAMUSCULAR | Status: AC
  Filled 2020-11-19: qty 2

## 2020-11-19 MED ORDER — TRAMADOL HCL 50 MG PO TABS: 50.0000 mg | ORAL_TABLET | Freq: Four times a day (QID) | ORAL | 0 refills | Status: AC | PRN

## 2020-11-19 MED ORDER — ONDANSETRON HCL 4 MG/2ML IJ SOLN
INTRAMUSCULAR | Status: AC
  Filled 2020-11-19: qty 2

## 2020-11-19 MED ORDER — PROMETHAZINE HCL 25 MG/ML IJ SOLN: 6.2500 mg | INTRAMUSCULAR | Status: DC | PRN

## 2020-11-19 MED ORDER — CEPHALEXIN 500 MG PO CAPS
ORAL_CAPSULE | ORAL | 0 refills | Status: AC
Start: 2020-11-19 — End: ?

## 2020-11-19 MED ORDER — IOHEXOL 300 MG/ML  SOLN
INTRAMUSCULAR | Status: DC | PRN
  Administered 2020-11-19: 7 mL

## 2020-11-19 MED ORDER — PROPOFOL 10 MG/ML IV BOLUS
INTRAVENOUS | Status: DC | PRN
  Administered 2020-11-19: 200 mg via INTRAVENOUS

## 2020-11-19 MED ORDER — FENTANYL CITRATE (PF) 100 MCG/2ML IJ SOLN
INTRAMUSCULAR | Status: DC | PRN
  Administered 2020-11-19 (×2): 50 ug via INTRAVENOUS

## 2020-11-19 MED ORDER — DEXAMETHASONE SODIUM PHOSPHATE 4 MG/ML IJ SOLN
INTRAMUSCULAR | Status: DC | PRN
  Administered 2020-11-19: 8 mg via INTRAVENOUS

## 2020-11-19 MED ORDER — SODIUM CHLORIDE 0.9 % IR SOLN
Status: DC | PRN
  Administered 2020-11-19: 3000 mL via INTRAVESICAL

## 2020-11-19 MED ORDER — KETOROLAC TROMETHAMINE 10 MG PO TABS: 10.0000 mg | ORAL_TABLET | Freq: Three times a day (TID) | ORAL | 0 refills | Status: AC | PRN

## 2020-11-19 MED ORDER — GENTAMICIN SULFATE 40 MG/ML IJ SOLN
340.0000 mg | INTRAVENOUS | Status: AC
  Administered 2020-11-19: 340 mg via INTRAVENOUS
  Filled 2020-11-19: qty 8.5

## 2020-11-19 MED ORDER — OXYCODONE HCL 5 MG PO TABS
ORAL_TABLET | ORAL | Status: AC
  Filled 2020-11-19: qty 1

## 2020-11-19 MED ORDER — LIDOCAINE 2% (20 MG/ML) 5 ML SYRINGE
INTRAMUSCULAR | Status: AC
  Filled 2020-11-19: qty 5

## 2020-11-19 MED ORDER — KETOROLAC TROMETHAMINE 30 MG/ML IJ SOLN
INTRAMUSCULAR | Status: AC
  Filled 2020-11-19: qty 1

## 2020-11-19 MED ORDER — LACTATED RINGERS IV SOLN
INTRAVENOUS | Status: DC
  Administered 2020-11-19: 1000 mL via INTRAVENOUS

## 2020-11-19 MED ORDER — PROPOFOL 10 MG/ML IV BOLUS
INTRAVENOUS | Status: AC
  Filled 2020-11-19: qty 20

## 2020-11-19 MED ORDER — LIDOCAINE HCL (CARDIAC) PF 100 MG/5ML IV SOSY
PREFILLED_SYRINGE | INTRAVENOUS | Status: DC | PRN
  Administered 2020-11-19: 100 mg via INTRAVENOUS

## 2020-11-19 MED ORDER — OXYCODONE HCL 5 MG PO TABS
5.0000 mg | ORAL_TABLET | Freq: Once | ORAL | Status: AC | PRN
  Administered 2020-11-19: 5 mg via ORAL

## 2020-11-19 MED ORDER — ACETAMINOPHEN 500 MG PO TABS
1000.0000 mg | ORAL_TABLET | Freq: Once | ORAL | Status: AC
  Administered 2020-11-19: 1000 mg via ORAL

## 2020-11-19 MED ORDER — LIDOCAINE 2% (20 MG/ML) 5 ML SYRINGE
INTRAMUSCULAR | Status: DC | PRN
  Administered 2020-11-19: 80 mg via INTRAVENOUS

## 2020-11-19 MED ORDER — KETOROLAC TROMETHAMINE 30 MG/ML IJ SOLN
INTRAMUSCULAR | Status: DC | PRN
  Administered 2020-11-19: 30 mg via INTRAVENOUS

## 2020-11-19 MED ORDER — MIDAZOLAM HCL 2 MG/2ML IJ SOLN
INTRAMUSCULAR | Status: AC
  Filled 2020-11-19: qty 2

## 2020-11-19 MED ORDER — FENTANYL CITRATE (PF) 100 MCG/2ML IJ SOLN
25.0000 ug | INTRAMUSCULAR | Status: DC | PRN
  Administered 2020-11-19: 50 ug via INTRAVENOUS

## 2020-11-19 MED ORDER — MIDAZOLAM HCL 2 MG/2ML IJ SOLN
INTRAMUSCULAR | Status: DC | PRN
  Administered 2020-11-19: 2 mg via INTRAVENOUS

## 2020-11-19 MED ORDER — 0.9 % SODIUM CHLORIDE (POUR BTL) OPTIME
TOPICAL | Status: DC | PRN
  Administered 2020-11-19: 500 mL

## 2020-11-19 MED ORDER — ACETAMINOPHEN 500 MG PO TABS
ORAL_TABLET | ORAL | Status: AC
  Filled 2020-11-19: qty 2

## 2020-11-19 MED ORDER — SCOPOLAMINE 1 MG/3DAYS TD PT72
1.0000 | MEDICATED_PATCH | TRANSDERMAL | Status: DC
  Administered 2020-11-19: 1.5 mg via TRANSDERMAL

## 2020-11-19 MED ORDER — OXYCODONE HCL 5 MG/5ML PO SOLN: 5.0000 mg | Freq: Once | ORAL | Status: AC | PRN

## 2020-11-19 MED ORDER — GENTAMICIN SULFATE 40 MG/ML IJ SOLN: 5.0000 mg/kg | INTRAVENOUS | Status: DC

## 2020-11-19 SURGICAL SUPPLY — 28 items
BAG DRAIN URO-CYSTO SKYTR STRL (DRAIN) ×3 IMPLANT
BAG DRN UROCATH (DRAIN) ×1
BASKET LASER NITINOL 1.9FR (BASKET) ×3 IMPLANT
BSKT STON RTRVL 120 1.9FR (BASKET) ×1
CATH INTERMIT  6FR 70CM (CATHETERS) ×3 IMPLANT
CLOTH BEACON ORANGE TIMEOUT ST (SAFETY) ×3 IMPLANT
COVER DOME SNAP 22 D (MISCELLANEOUS) ×3 IMPLANT
FIBER LASER FLEXIVA 365 (UROLOGICAL SUPPLIES) IMPLANT
GLOVE SURG ENC MOIS LTX SZ7.5 (GLOVE) ×3 IMPLANT
GOWN STRL REUS W/TWL LRG LVL3 (GOWN DISPOSABLE) ×3 IMPLANT
GUIDEWIRE ANG ZIPWIRE 038X150 (WIRE) ×3 IMPLANT
GUIDEWIRE STR DUAL SENSOR (WIRE) ×3 IMPLANT
IV NS 1000ML (IV SOLUTION) ×3
IV NS 1000ML BAXH (IV SOLUTION) ×1 IMPLANT
IV NS IRRIG 3000ML ARTHROMATIC (IV SOLUTION) ×6 IMPLANT
KIT TURNOVER CYSTO (KITS) ×3 IMPLANT
MANIFOLD NEPTUNE II (INSTRUMENTS) ×3 IMPLANT
NS IRRIG 500ML POUR BTL (IV SOLUTION) ×3 IMPLANT
PACK CYSTO (CUSTOM PROCEDURE TRAY) ×3 IMPLANT
SHEATH URETERAL 12FRX28CM (UROLOGICAL SUPPLIES) ×3 IMPLANT
STENT POLARIS 5FRX24 (STENTS) ×3 IMPLANT
SYR 10ML LL (SYRINGE) ×3 IMPLANT
TRACTIP FLEXIVA PULS ID 200XHI (Laser) ×1 IMPLANT
TRACTIP FLEXIVA PULSE ID 200 (Laser) ×3
TUBE CONNECTING 12'X1/4 (SUCTIONS) ×1
TUBE CONNECTING 12X1/4 (SUCTIONS) ×2 IMPLANT
TUBE FEEDING 8FR 16IN STR KANG (MISCELLANEOUS) IMPLANT
TUBING UROLOGY SET (TUBING) ×3 IMPLANT

## 2020-11-19 NOTE — Anesthesia Postprocedure Evaluation (Signed)
Anesthesia Post Note  Patient: Margaret Rice  Procedure(s) Performed: CYSTOSCOPY WITH RETROGRADE PYELOGRAM, URETEROSCOPY AND STENT REPLACEMENT (Left Ureter) HOLMIUM LASER APPLICATION (Left Ureter)     Patient location during evaluation: PACU Anesthesia Type: General Level of consciousness: awake and alert Pain management: pain level controlled Vital Signs Assessment: post-procedure vital signs reviewed and stable Respiratory status: spontaneous breathing, nonlabored ventilation and respiratory function stable Cardiovascular status: blood pressure returned to baseline and stable Postop Assessment: no apparent nausea or vomiting Anesthetic complications: no   No complications documented.  Last Vitals:  Vitals:   11/19/20 1000 11/19/20 1030  BP: 127/83 (!) 131/91  Pulse: 65 77  Resp: 12 18  Temp:  36.6 C  SpO2: 97% 100%    Last Pain:  Vitals:   11/19/20 1017  TempSrc:   PainSc: 3                  Cecile Hearing

## 2020-11-19 NOTE — Anesthesia Procedure Notes (Signed)
Procedure Name: LMA Insertion Date/Time: 11/19/2020 8:34 AM Performed by: Earmon Phoenix, CRNA Pre-anesthesia Checklist: Patient identified, Patient being monitored, Emergency Drugs available, Timeout performed and Suction available Patient Re-evaluated:Patient Re-evaluated prior to induction Oxygen Delivery Method: Circle System Utilized Preoxygenation: Pre-oxygenation with 100% oxygen Induction Type: IV induction Ventilation: Mask ventilation without difficulty LMA: LMA inserted LMA Size: 4.0 Number of attempts: 1 Placement Confirmation: positive ETCO2 and breath sounds checked- equal and bilateral

## 2020-11-19 NOTE — H&P (Signed)
Margaret Rice is an 40 y.o. female.    Chief Complaint: Pre-OP LEFT Ureteroscopic Stone Manipulation  HPI:   1 - Left Ureteral Stone with Refractory Colic - 37mm left UPJ stone with mild hydro by ER CT 10/2020 on eval left flank pain. Some ipsilateral lower pole milk of calcium stone as well. Underwent Left ureteral stent 10/26/20 as temporizing measure in setting of obstructing pyelo. Final CX from that time negative and cleared infectious parameters on rocephin.   Today "Margaret Rice" is seen for left ureteroscopic stone manipulation with goal of stone free. C19 screen negative. No interval fevers.   Past Medical History:  Diagnosis Date  . History of kidney stones    felt like passed 1 stone 11-15-2020  . Hypertension   . PCOS (polycystic ovarian syndrome)   . Vitamin D deficiency   . Wears glasses     Past Surgical History:  Procedure Laterality Date  . CYSTOSCOPY/URETEROSCOPY/HOLMIUM LASER/STENT PLACEMENT Left 10/26/2020   Procedure: CYSTOSCOPY/URETEROSCOPY/HOLMIUM LASER/STENT PLACEMENT;  Surgeon: [name], MD;  Location: [facility];  Service: Urology;  Laterality: Left;  . MOLE REMOVAL  yrs ago   BACK    Family History  Problem Relation Age of Onset  . Hypertension Mother   . Hypertension Father   . Diverticulitis Father         PARTIAL COLON REMOVED  . Heart disease Maternal Grandmother   . Heart disease Paternal Grandmother    Social History:  reports that she has never smoked. She has never used smokeless tobacco. She reports that she does not drink alcohol and does not use drugs.  Allergies: No Known Allergies  No medications prior to admission.    No results found for this or any previous visit (from the past 48 hour(s)). No results found.  Review of Systems  Constitutional: Negative for chills and fever.  Genitourinary: Positive for urgency.  All other systems reviewed and are negative.   Height 5\' 4"  (1.626 m), weight 90.7 kg, last menstrual period  11/15/2020. Physical Exam Vitals reviewed.  Constitutional:      Comments: Very pleasant, at baseline.   HENT:     Head: Normocephalic.     Nose: Nose normal.     Mouth/Throat:     Mouth: Mucous membranes are moist.  Eyes:     Pupils: Pupils are equal, round, and reactive to light.  Cardiovascular:     Rate and Rhythm: Normal rate.     Pulses: Normal pulses.  Pulmonary:     Effort: Pulmonary effort is normal.  Abdominal:     General: Abdomen is flat.  Genitourinary:    Comments: No CVAT at present Musculoskeletal:     Cervical back: Normal range of motion.  Skin:    General: Skin is warm.  Neurological:     Mental Status: She is alert.  Psychiatric:        Mood and Affect: Mood normal.      Assessment/Plan  Proceed as planned with LEFT ureteroscopic stone manipulation. Risks, benefits, alternatives, expected peri-op course discussed previously and reiterated today.   11/17/2020, MD 11/19/2020, 6:38 AM

## 2020-11-19 NOTE — Anesthesia Preprocedure Evaluation (Signed)
Anesthesia Evaluation  Patient identified by MRN, date of birth, ID band Patient awake    Reviewed: Allergy & Precautions, NPO status , Patient's Chart, lab work & pertinent test results, reviewed documented beta blocker date and time   Airway Mallampati: II  TM Distance: >3 FB Neck ROM: Full    Dental  (+) Teeth Intact, Dental Advisory Given   Pulmonary neg pulmonary ROS,    Pulmonary exam normal breath sounds clear to auscultation       Cardiovascular hypertension, Pt. on home beta blockers Normal cardiovascular exam Rhythm:Regular Rate:Normal     Neuro/Psych negative neurological ROS     GI/Hepatic negative GI ROS, Neg liver ROS,   Endo/Other  Obesity   Renal/GU LEFT URETERAL STONE     Musculoskeletal negative musculoskeletal ROS (+)   Abdominal   Peds  Hematology negative hematology ROS (+)   Anesthesia Other Findings Day of surgery medications reviewed with the patient.  Reproductive/Obstetrics                             Anesthesia Physical Anesthesia Plan  ASA: II  Anesthesia Plan: General   Post-op Pain Management:    Induction: Intravenous  PONV Risk Score and Plan: 4 or greater and Midazolam, Dexamethasone and Ondansetron  Airway Management Planned: LMA  Additional Equipment:   Intra-op Plan:   Post-operative Plan: Extubation in OR  Informed Consent: I have reviewed the patients History and Physical, chart, labs and discussed the procedure including the risks, benefits and alternatives for the proposed anesthesia with the patient or authorized representative who has indicated his/her understanding and acceptance.     Dental advisory given  Plan Discussed with: CRNA  Anesthesia Plan Comments:         Anesthesia Quick Evaluation

## 2020-11-19 NOTE — Transfer of Care (Signed)
Immediate Anesthesia Transfer of Care Note  Patient: Marasia Newhall  Procedure(s) Performed: CYSTOSCOPY WITH RETROGRADE PYELOGRAM, URETEROSCOPY AND STENT REPLACEMENT (Left Ureter) HOLMIUM LASER APPLICATION (Left Ureter)  Patient Location: PACU  Anesthesia Type:General  Level of Consciousness: awake, alert  and oriented  Airway & Oxygen Therapy: Patient Spontanous Breathing and Patient connected to nasal cannula oxygen  Post-op Assessment: Report given to RN  Post vital signs: Reviewed and stable  Last Vitals:  Vitals Value Taken Time  BP 136/95 11/19/20 0920  Temp    Pulse 71 11/19/20 0921  Resp 10 11/19/20 0921  SpO2 100 % 11/19/20 0921  Vitals shown include unvalidated device data.  Last Pain:  Vitals:   11/19/20 0728  TempSrc: Oral  PainSc: 0-No pain      Patients Stated Pain Goal: 6 (11/19/20 0728)  Complications: No complications documented.

## 2020-11-19 NOTE — Op Note (Signed)
NAMEDALLY, OSHEL MEDICAL RECORD NO: [id number] ACCOUNT NO: [id number] DATE OF BIRTH: June 22, 1981 FACILITY: [facility] LOCATION: [facility] PHYSICIAN: [name], MD  Operative Report   DATE OF PROCEDURE: 11/19/2020  PREOPERATIVE DIAGNOSIS:  Left renal/ureteral stone, history of urinary tract infection.  PROCEDURE PERFORMED:   1.  Cystoscopy, left retrograde pyelogram interpretation. 2.  Left ureteroscopy with laser lithotripsy. 3.  Exchange of left ureteral stent, 5 x 24 Polaris.  BLOOD LOSS:  Nil.  COMPLICATIONS:  None.  SPECIMEN:  Left renal/ureteral stone fragments for composite analysis.  FINDINGS:   1.  Retrograde positioning of prior ureteral stone to renal pelvis. 2.  Complete resolution of all accessible stone fragments larger than 1 mm following laser lithotripsy and basket extraction. 3.  Successful placement of left ureteral stent, proximal end in the renal pelvis, distal end in the urinary bladder.  INDICATIONS FOR PROCEDURE:  This is a pleasant 40 year old lady with recent history of a left ureteral stone that was complicated by pyelonephritis.  She underwent stenting as a temporizing measure last month. She has cleared her infectious parameters  and now presents for definitive management today with ureteroscopy.  Informed consent was signed and placed in the medical record.  PROCEDURE IN DETAIL:  The patient being verified, procedure being left ureteroscopic stone reduction was confirmed.  Procedure timeout was performed.  Intravenous antibiotics administered.  General LMA anesthesia induced.  The patient was placed in the  low lithotomy position.  A sterile field was created, prepped and draped the patient's vagina, introitus and proximal thighs using iodine.  Cystourethroscopy was performed using 21-French rigid cystoscope with offset lens.  Inspection of the bladder  revealed no diverticula, calcifications, papillary lesions, distal end of the left stent was seen,  grasped and brought to the level of the urethral meatus and a 0.038 ZIPwire was advanced to the level of the upper pole exchanged for an open-ended  catheter and left retrograde pyelogram was obtained.  Left retrograde pyelogram demonstrated single left ureter and single system left kidney.  No filling defects were noted.  The ZIPwire was once again advanced set aside as a safety wire.  A 6 French feeding tube placed in the urinary bladder and pressure  released and semirigid ureteroscopy was performed.  The distal four-fifths of the left ureter alongside as a separate sensor working wire.  No mucosal abnormalities are found.  The previous left ureteral stone was not visualized.  The semirigid scope was  exchanged for a 12/14 short length ureteral access sheath at the level of the proximal ureter.  Using continuous fluoroscopic guidance, a flexible digital ureteroscopy was performed in the proximal left ureter and systematic inspection of the left  kidney, including all calices x3.  There was a dominant stone in the lower mid calix likely corresponding to the retrograde positioning of prior ureteral stone, given its size, this was at a somewhat acute angulation that was grasped with an escape  basket and removed in the upper pole calix to allow for a less acute angulation and holmium laser energy applied to the stone using setting of 0.2 joules and 20 Hz and approximately 50% of the stone was dusted 50% fragmented, the fragments are being  approximately 1-2 mm.  They were then amenable to simple basketing with the Escape basket.  Following this, complete resolution of all accessible stone fragments larger than 1 mm.  Excellent hemostasis.  No evidence of perforation.  Access sheath was  removed under continuous vision and no significant mucosal  abnormalities were found.  Given access sheath usage and prior infectious history, it was felt that brief interval stenting with tether stent would be most prudent  as such a new 5 x 24  Polaris-type stent was placed over remaining safety wire using fluoroscopic guidance.  Good proximal and distal planes were noted.  Tether was left in place, tucked to her vagina and the procedure was terminated.  The patient tolerated the procedure well  and no immediate perioperative complications.  The patient was taken to the postanesthesia care unit in stable condition.  Plan for discharge home.   PUS D: 11/19/2020 9:20:12 am T: 11/19/2020 12:04:00 pm  JOB: [id number]/ [id number]

## 2020-11-19 NOTE — Brief Op Note (Signed)
11/19/2020  9:15 AM  PATIENT:  Margaret Rice  40 y.o. female  PRE-OPERATIVE DIAGNOSIS:  LEFT URETERAL STONE  POST-OPERATIVE DIAGNOSIS:  LEFT URETERAL STONE  PROCEDURE:  Procedure(s): CYSTOSCOPY WITH RETROGRADE PYELOGRAM, URETEROSCOPY AND STENT REPLACEMENT (Left) HOLMIUM LASER APPLICATION (Left)  SURGEON:  Surgeon(s) and Role:    [name], MD - Primary  PHYSICIAN ASSISTANT:   ASSISTANTS: none   ANESTHESIA:   general  EBL:  5 mL   BLOOD ADMINISTERED:none  DRAINS: none   LOCAL MEDICATIONS USED:  NONE  SPECIMEN:  Source of Specimen:  left renal / ureteral stone fragments  DISPOSITION OF SPECIMEN:  [facility] for compositional analysis  COUNTS:  YES  TOURNIQUET:  * No tourniquets in log *  DICTATION: .Other Dictation: Dictation Number [id number]  PLAN OF CARE: DC HOME  PATIENT DISPOSITION:  PACU - hemodynamically stable.   Delay start of Pharmacological VTE agent (>24hrs) due to surgical blood loss or risk of bleeding: yes

## 2020-11-19 NOTE — Discharge Instructions (Signed)
1 - You may have urinary urgency (bladder spasms) and bloody urine on / off with stent in place. This is normal.  2 - Remove tethered stent on Monday monrning at home by pulling string, then blue-white plastic tubing, and discarding. Office is open Monday if any problems arise.   3 - Call MD or go to ER for fever >102, severe pain / nausea / vomiting not relieved by medications, or acute change in medical status.   Post Anesthesia Home Care Instructions  Activity: Get plenty of rest for the remainder of the day. A responsible individual must stay with you for 24 hours following the procedure.  For the next 24 hours, DO NOT: -Drive a car -Advertising copywriter -Drink alcoholic beverages -Take any medication unless instructed by your physician -Make any legal decisions or sign important papers.  Meals: Start with liquid foods such as gelatin or soup. Progress to regular foods as tolerated. Avoid greasy, spicy, heavy foods. If nausea and/or vomiting occur, drink only clear liquids until the nausea and/or vomiting subsides. Call your physician if vomiting continues.  Special Instructions/Symptoms: Your throat may feel dry or sore from the anesthesia or the breathing tube placed in your throat during surgery. If this causes discomfort, gargle with warm salt water. The discomfort should disappear within 24 hours.  If you had a scopolamine patch placed behind your ear for the management of post- operative nausea and/or vomiting:  1. The medication in the patch is effective for 72 hours, after which it should be removed.  Wrap patch in a tissue and discard in the trash. Wash hands thoroughly with soap and water. 2. You may remove the patch earlier than 72 hours if you experience unpleasant side effects which may include dry mouth, dizziness or visual disturbances. 3. Avoid touching the patch. Wash your hands with soap and water after contact with the patch.    Remove patch behind Right ear by   Monday, Nov 22, 2020.  [facility] [phone] Post Ureteroscopy Stent Instructions  Definitions:  Ureter: The duct that transports urine from the kidney to the bladder. Stent:   A plastic hollow tube that is placed into the ureter, from the kidney to the bladder to prevent the ureter from swelling shut.  GENERAL INSTRUCTIONS:  Despite the fact that no skin incisions were used, the area around the ureter and bladder is raw and irritated. The stent is a foreign body which will further irritate the bladder wall. This irritation is manifested by increased frequency of urination, both day and night, and by an increase in the urge to urinate. In some, the urge to urinate is present almost always. Sometimes the urge is strong enough that you may not be able to stop yourself from urinating. The only real cure is to remove the stent and then give time for the bladder wall to heal which can't be done until the danger of the ureter swelling shut has passed, which varies.  You may see some blood in your urine while the stent is in place and a few days afterwards. Do not be alarmed, even if the urine was clear for a while. Get off your feet and drink lots of fluids until clearing occurs. If you start to pass clots or don't improve, call us.  DIET: You may return to your normal diet immediately. Because of the raw surface of your bladder, alcohol, spicy foods, acid type foods and drinks with caffeine may cause irritation or frequency and should  be used in moderation. To keep your urine flowing freely and to avoid constipation, drink plenty of fluids during the day ( 8-10 glasses ). Tip: Avoid cranberry juice because it is very acidic.  ACTIVITY: Your physical activity doesn't need to be restricted. However, if you are very active, you may see some blood in your urine. We suggest that you reduce your activity under these circumstances until the bleeding has stopped.  BOWELS: It is  important to keep your bowels regular during the postoperative period. Straining with bowel movements can cause bleeding. A bowel movement every other day is reasonable. Use a mild laxative if needed, such as Milk of Magnesia 2-3 tablespoons, or 2 Dulcolax tablets. Call if you continue to have problems. If you have been taking narcotics for pain, before, during or after your surgery, you may be constipated. Take a laxative if necessary.   MEDICATION: You should resume your pre-surgery medications unless told not to. In addition you will often be given an antibiotic to prevent infection. These should be taken as prescribed until the bottles are finished unless you are having an unusual reaction to one of the drugs.  PROBLEMS YOU SHOULD REPORT TO [place]:  Fevers over 100.5 Fahrenheit.  Heavy bleeding, or clots ( See above notes about blood in urine ).  Inability to urinate.  Drug reactions ( hives, rash, nausea, vomiting, diarrhea ).  Severe burning or pain with urination that is not improving.  FOLLOW-UP: You will need a follow-up appointment to monitor your progress. Call for this appointment at the number listed above. Usually the first appointment will be about three to fourteen days after your surgery.

## 2020-11-22 ENCOUNTER — Encounter (HOSPITAL_BASED_OUTPATIENT_CLINIC_OR_DEPARTMENT_OTHER): Payer: Self-pay | Admitting: Urology

## 2022-06-28 IMAGING — CT CT RENAL STONE PROTOCOL
2 of 4 series · 16 of 46 positions shown, 18 images · non-contrast
Comparison: July 21, 2013

CLINICAL DATA: Left flank pain

EXAM:
CT ABDOMEN AND PELVIS WITHOUT CONTRAST
TECHNIQUE: Multidetector CT imaging of the abdomen and pelvis was performed
following the standard protocol without oral or IV contrast.

[Series 2: axial st · axial · 0.89mm/px · z∈[+957,+1382]mm · 13 of 97 slices shown, 15 images]
[im 6/97  soft-tissue]
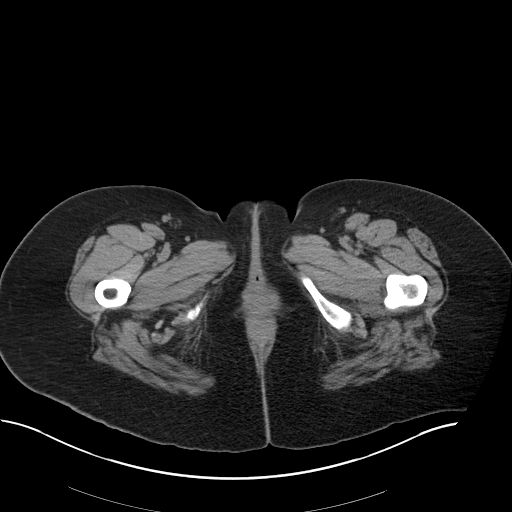
[im 6/97  bone]
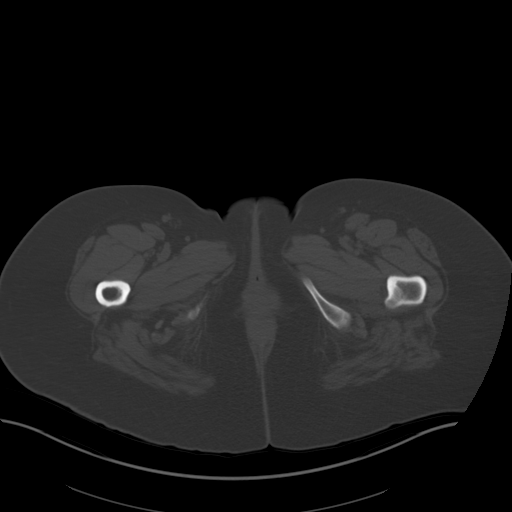
[im 12/97  soft-tissue]
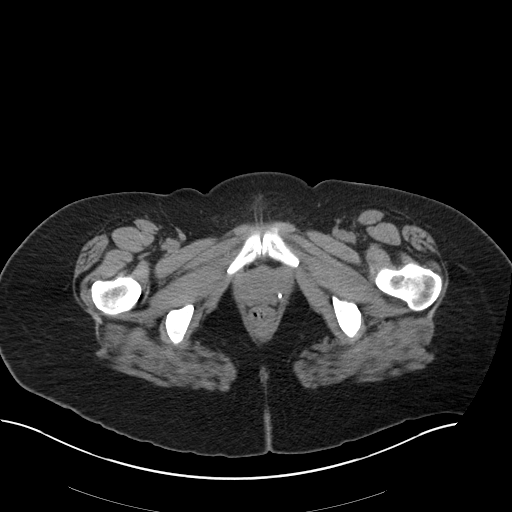
[im 23/97  soft-tissue]
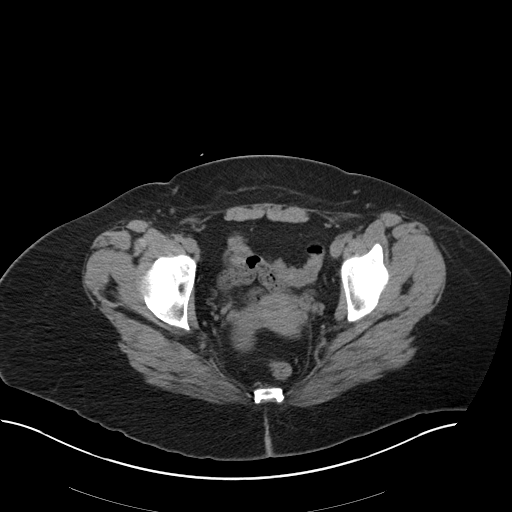
[im 29/97  soft-tissue]
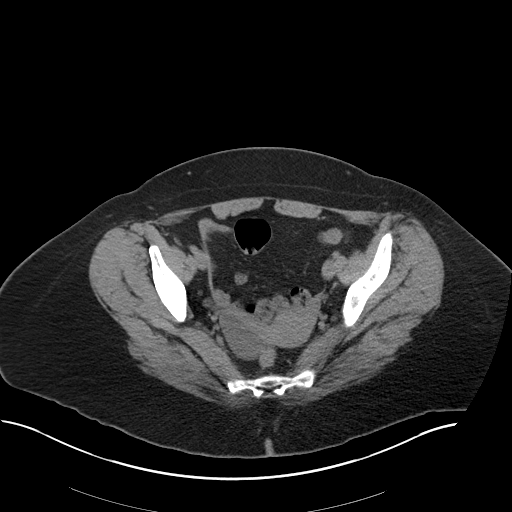
[im 34/97  soft-tissue]
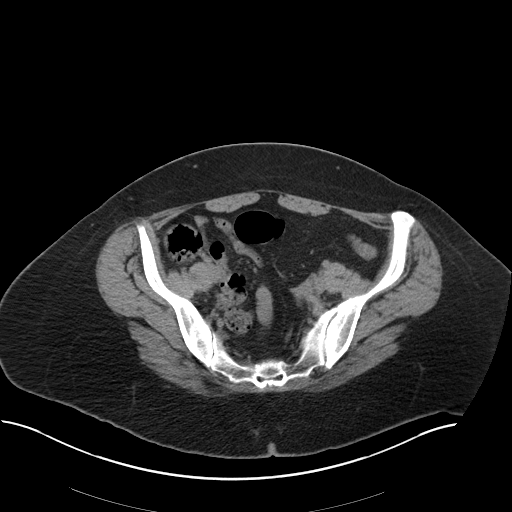
[im 40/97  soft-tissue]
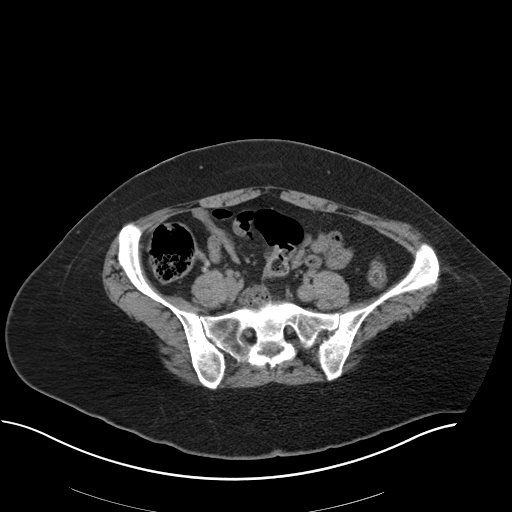
[im 51/97  soft-tissue]
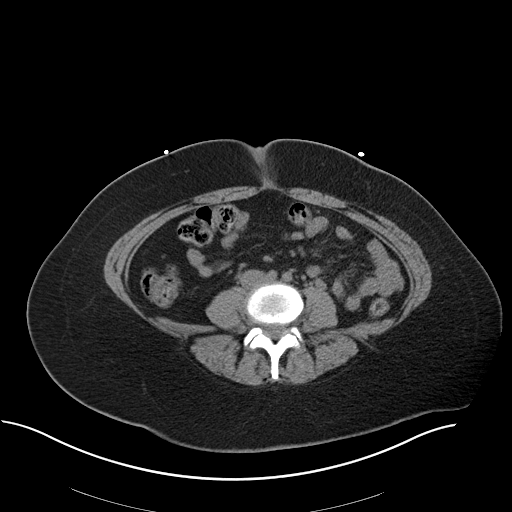
[im 57/97  soft-tissue]
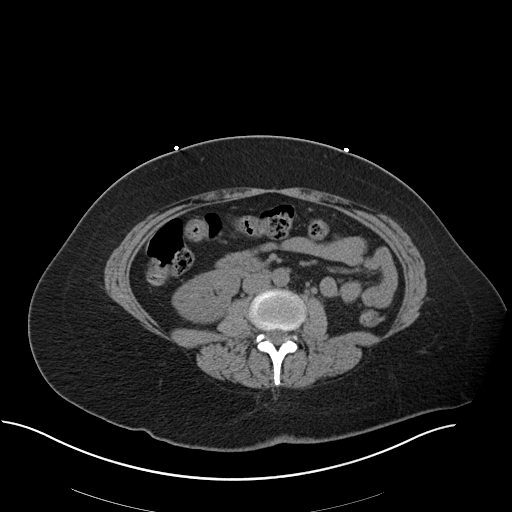
[im 63/97  soft-tissue]
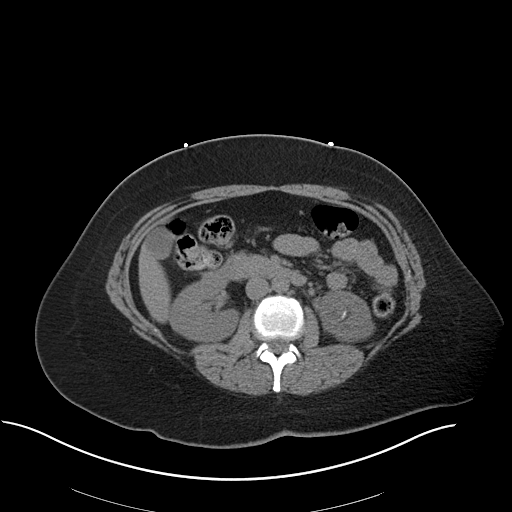
[im 63/97  bone]
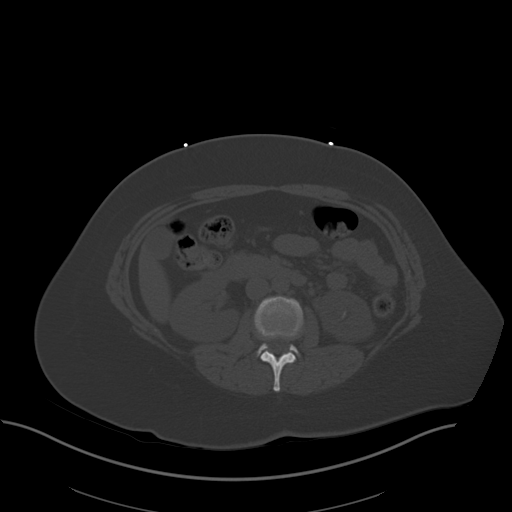
[im 68/97  soft-tissue]
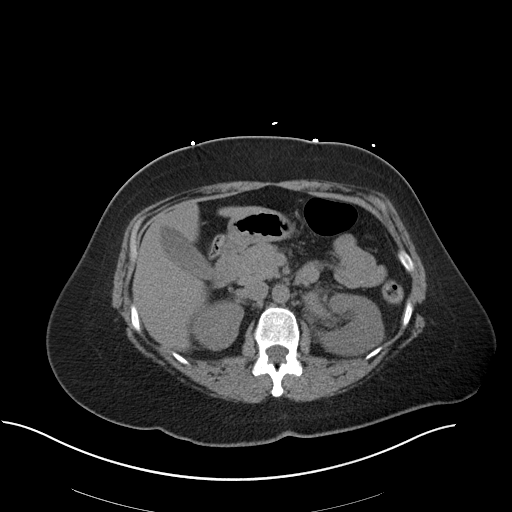
[im 74/97  soft-tissue]
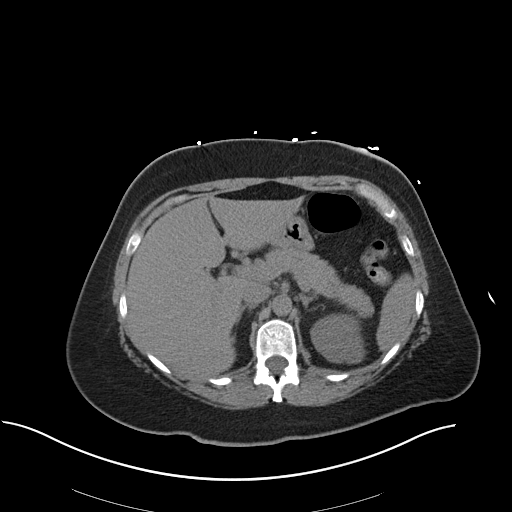
[im 85/97  soft-tissue]
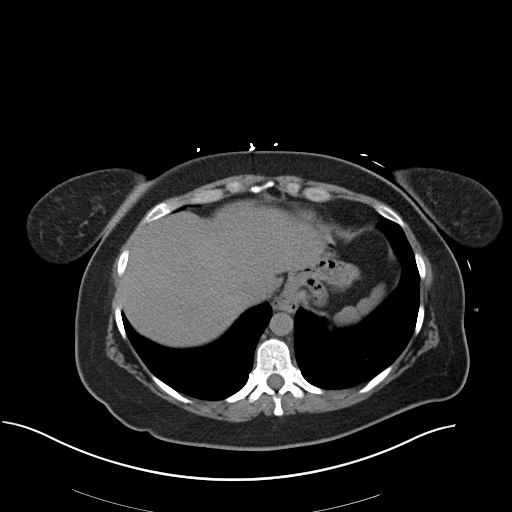
[im 91/97  soft-tissue]
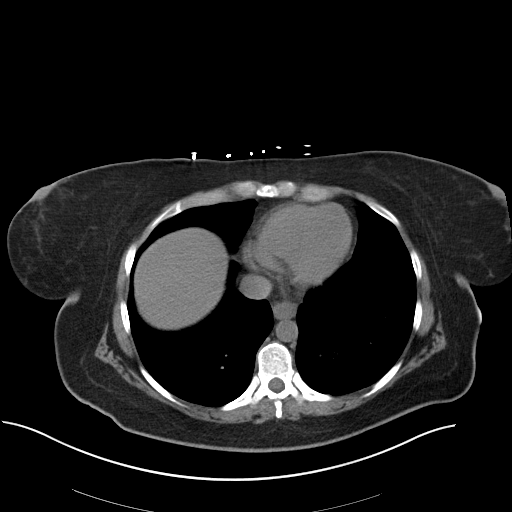

[Series 4: coronal · coronal · 0.83mm/px · 3 of 150 slices shown]
[im 50/150  soft-tissue]
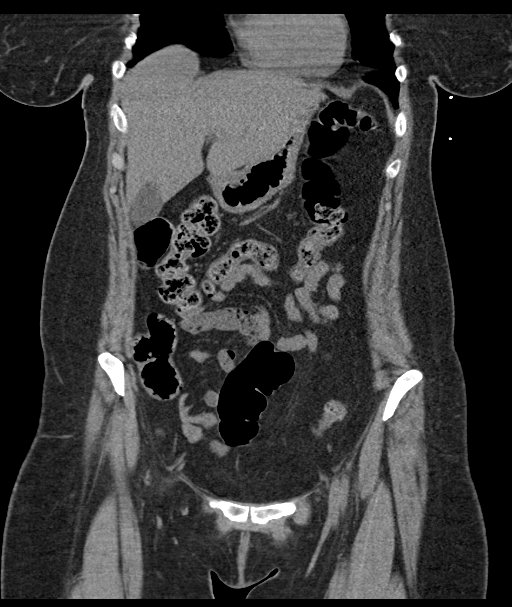
[im 67/150  soft-tissue]
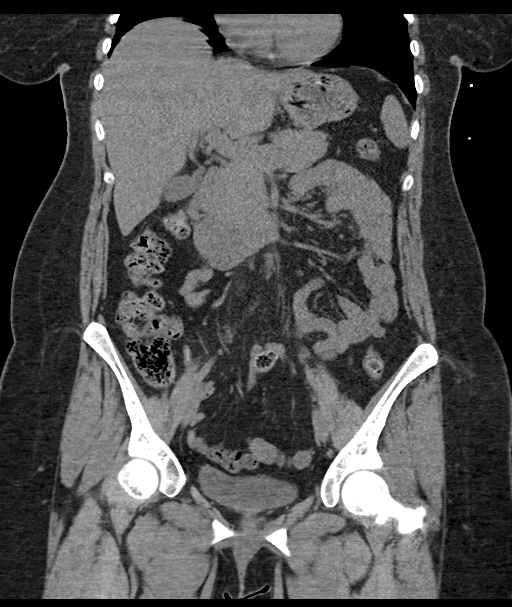
[im 83/150  soft-tissue]
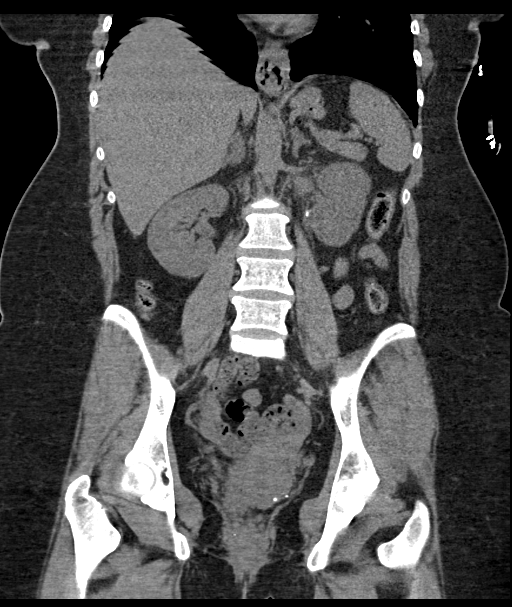

[16 of 46 positions shown; findings below may reference images not displayed]

FINDINGS: Lower chest: Lung bases are clear.  There is a focal hiatal hernia.

Hepatobiliary: No focal liver lesions are evident on this
noncontrast enhanced study. Gallbladder wall is not appreciably
thickened. There is no biliary duct dilatation.

Pancreas: There is no pancreatic mass or inflammatory focus.

Spleen: No splenic lesions are evident.

Adrenals/Urinary Tract: Adrenals bilaterally appear normal. Left
kidney is subtly edematous. There is no evident renal mass on either
side. There is mild hydronephrosis on the left. There is no
appreciable hydronephrosis on the right. There is a 4 x 2 mm
calculus in the lower pole of the left kidney. There is a calculus
at the left ureteropelvic junction measuring 8 x 4 mm. No other
ureteral calculi are evident. Urinary bladder is midline with wall
thickness within normal limits.

Stomach/Bowel: There is no appreciable bowel wall or mesenteric
thickening. No evident bowel obstruction. Terminal ileum appears
normal. Appendix appears normal. No free air or portal venous air.

Vascular/Lymphatic: No abdominal aortic aneurysm. No appreciable
vascular lesions evident on this noncontrast enhanced study. There
is no evident adenopathy in the abdomen or pelvis.

Reproductive: Uterus is retroverted. There is a cystic mass arising
from the right ovary measuring 4.3 x 3.5 cm. No other pelvic mass
evident.

Other: No abscess or ascites evident in the abdomen or pelvis.

Musculoskeletal: No blastic or lytic bone lesions. No intramuscular
or abdominal wall lesions.
IMPRESSION: 1. 8 x 4 mm calculus at the left ureteropelvic junction causing mild
hydronephrosis and subtle left renal edema. There is also a 4 x 2 mm
nonobstructing calculus in the lower pole left kidney.

2.  Fairly small focal hiatal hernia.

3. No bowel wall thickening or bowel obstruction. No abscess in the
abdomen or pelvis. Appendix appears normal.

4. Cystic right adnexal mass measuring 4.3 x 3.5 cm. Simple cyst
most likely etiology. No follow-up imaging recommended unless
clinical symptoms related to this area present. Note: This
recommendation does not apply to premenarchal patients and to those
with increased risk (genetic, family history, elevated tumor markers
or other high-risk factors) of ovarian cancer. Reference: JACR [id number]

## 2024-07-14 ENCOUNTER — Other Ambulatory Visit: Payer: Self-pay | Admitting: Family Medicine

## 2024-07-14 DIAGNOSIS — R1032 Left lower quadrant pain: Secondary | ICD-10-CM

## 2024-07-28 ENCOUNTER — Other Ambulatory Visit

## 2024-08-07 ENCOUNTER — Ambulatory Visit
Admission: RE | Admit: 2024-08-07 | Discharge: 2024-08-07 | Disposition: A | Source: Ambulatory Visit | Attending: Family Medicine

## 2024-08-07 DIAGNOSIS — R1032 Left lower quadrant pain: Secondary | ICD-10-CM
